# Patient Record
Sex: Male | Born: 1935 | Race: White | Hispanic: No | Smoking: Current every day smoker
Health system: Southern US, Community
[De-identification: ages and names within clinical notes are randomized; demographics above are authoritative.]

## PROBLEM LIST (undated history)

## (undated) DIAGNOSIS — I639 Cerebral infarction, unspecified: Secondary | ICD-10-CM

## (undated) DIAGNOSIS — H548 Legal blindness, as defined in USA: Secondary | ICD-10-CM

## (undated) DIAGNOSIS — F329 Major depressive disorder, single episode, unspecified: Secondary | ICD-10-CM

## (undated) DIAGNOSIS — I10 Essential (primary) hypertension: Secondary | ICD-10-CM

## (undated) DIAGNOSIS — M545 Low back pain, unspecified: Secondary | ICD-10-CM

## (undated) DIAGNOSIS — I739 Peripheral vascular disease, unspecified: Secondary | ICD-10-CM

## (undated) DIAGNOSIS — E7800 Pure hypercholesterolemia, unspecified: Secondary | ICD-10-CM

## (undated) DIAGNOSIS — F419 Anxiety disorder, unspecified: Secondary | ICD-10-CM

## (undated) DIAGNOSIS — F32A Depression, unspecified: Secondary | ICD-10-CM

## (undated) DIAGNOSIS — G8929 Other chronic pain: Secondary | ICD-10-CM

## (undated) DIAGNOSIS — Z8719 Personal history of other diseases of the digestive system: Secondary | ICD-10-CM

## (undated) DIAGNOSIS — I6529 Occlusion and stenosis of unspecified carotid artery: Secondary | ICD-10-CM

## (undated) HISTORY — PX: BACK SURGERY: SHX140

## (undated) HISTORY — DX: Legal blindness, as defined in USA: H54.8

## (undated) HISTORY — DX: Major depressive disorder, single episode, unspecified: F32.9

## (undated) HISTORY — DX: Depression, unspecified: F32.A

## (undated) HISTORY — DX: Peripheral vascular disease, unspecified: I73.9

## (undated) HISTORY — PX: PROSTATE SURGERY: SHX751

## (undated) HISTORY — PX: LUMBAR DISC SURGERY: SHX700

## (undated) HISTORY — PX: ANTERIOR CERVICAL DECOMP/DISCECTOMY FUSION: SHX1161

## (undated) HISTORY — PX: TONSILLECTOMY: SUR1361

## (undated) HISTORY — DX: Anxiety disorder, unspecified: F41.9

## (undated) HISTORY — PX: CATARACT EXTRACTION W/ INTRAOCULAR LENS  IMPLANT, BILATERAL: SHX1307

## (undated) HISTORY — DX: Occlusion and stenosis of unspecified carotid artery: I65.29

---

## 1998-10-09 ENCOUNTER — Encounter: Payer: Self-pay | Admitting: Emergency Medicine

## 1998-10-09 ENCOUNTER — Emergency Department (HOSPITAL_COMMUNITY): Admission: EM | Admit: 1998-10-09 | Discharge: 1998-10-09 | Payer: Self-pay | Admitting: Emergency Medicine

## 2003-07-24 HISTORY — PX: INGUINAL HERNIA REPAIR: SUR1180

## 2003-08-19 ENCOUNTER — Encounter (HOSPITAL_BASED_OUTPATIENT_CLINIC_OR_DEPARTMENT_OTHER): Payer: Self-pay | Admitting: General Surgery

## 2003-08-20 ENCOUNTER — Ambulatory Visit (HOSPITAL_COMMUNITY): Admission: RE | Admit: 2003-08-20 | Discharge: 2003-08-20 | Payer: Self-pay | Admitting: General Surgery

## 2007-10-10 ENCOUNTER — Ambulatory Visit: Payer: Self-pay | Admitting: Vascular Surgery

## 2009-04-22 HISTORY — PX: AORTOGRAM: SHX6300

## 2009-04-28 ENCOUNTER — Ambulatory Visit: Payer: Self-pay | Admitting: Vascular Surgery

## 2009-05-06 ENCOUNTER — Ambulatory Visit: Payer: Self-pay | Admitting: Vascular Surgery

## 2009-05-12 ENCOUNTER — Ambulatory Visit (HOSPITAL_COMMUNITY): Admission: RE | Admit: 2009-05-12 | Discharge: 2009-05-12 | Payer: Self-pay | Admitting: Vascular Surgery

## 2009-05-12 ENCOUNTER — Ambulatory Visit: Payer: Self-pay | Admitting: Vascular Surgery

## 2009-05-27 ENCOUNTER — Telehealth (INDEPENDENT_AMBULATORY_CARE_PROVIDER_SITE_OTHER): Payer: Self-pay | Admitting: *Deleted

## 2009-05-28 ENCOUNTER — Ambulatory Visit: Payer: Self-pay

## 2009-05-28 ENCOUNTER — Encounter: Payer: Self-pay | Admitting: Cardiovascular Disease

## 2009-06-03 ENCOUNTER — Encounter: Admission: RE | Admit: 2009-06-03 | Discharge: 2009-06-03 | Payer: Self-pay | Admitting: Vascular Surgery

## 2009-06-03 ENCOUNTER — Ambulatory Visit: Payer: Self-pay | Admitting: Vascular Surgery

## 2010-01-02 ENCOUNTER — Ambulatory Visit: Payer: Self-pay | Admitting: Vascular Surgery

## 2011-02-09 ENCOUNTER — Inpatient Hospital Stay (HOSPITAL_COMMUNITY)
Admission: EM | Admit: 2011-02-09 | Discharge: 2011-02-10 | DRG: 065 | Disposition: A | Discharge status: Home / Self Care | Payer: Medicare Other | Attending: Internal Medicine | Admitting: Internal Medicine

## 2011-02-09 ENCOUNTER — Emergency Department (HOSPITAL_COMMUNITY): Payer: Medicare Other

## 2011-02-09 DIAGNOSIS — I6350 Cerebral infarction due to unspecified occlusion or stenosis of unspecified cerebral artery: Principal | ICD-10-CM | POA: Diagnosis present

## 2011-02-09 DIAGNOSIS — H548 Legal blindness, as defined in USA: Secondary | ICD-10-CM | POA: Diagnosis present

## 2011-02-09 DIAGNOSIS — F17200 Nicotine dependence, unspecified, uncomplicated: Secondary | ICD-10-CM | POA: Diagnosis present

## 2011-02-09 DIAGNOSIS — I10 Essential (primary) hypertension: Secondary | ICD-10-CM | POA: Diagnosis present

## 2011-02-09 DIAGNOSIS — G8190 Hemiplegia, unspecified affecting unspecified side: Secondary | ICD-10-CM | POA: Diagnosis present

## 2011-02-09 DIAGNOSIS — I739 Peripheral vascular disease, unspecified: Secondary | ICD-10-CM | POA: Diagnosis present

## 2011-02-09 DIAGNOSIS — E785 Hyperlipidemia, unspecified: Secondary | ICD-10-CM | POA: Diagnosis present

## 2011-02-09 LAB — COMPREHENSIVE METABOLIC PANEL
Albumin: 3.9 g/dL (ref 3.5–5.2)
Alkaline Phosphatase: 86 U/L (ref 39–117)
BUN: 9 mg/dL (ref 6–23)
Chloride: 106 mEq/L (ref 96–112)
Creatinine, Ser: 1.02 mg/dL (ref 0.4–1.5)
Glucose, Bld: 96 mg/dL (ref 70–99)
Total Bilirubin: 0.4 mg/dL (ref 0.3–1.2)

## 2011-02-09 LAB — CBC
MCHC: 34 g/dL (ref 30.0–36.0)
MCV: 92.8 fL (ref 78.0–100.0)
Platelets: 284 10*3/uL (ref 150–400)
RDW: 14.3 % (ref 11.5–15.5)
WBC: 9.2 10*3/uL (ref 4.0–10.5)

## 2011-02-09 LAB — CARDIAC PANEL(CRET KIN+CKTOT+MB+TROPI)
CK, MB: 5.5 ng/mL — ABNORMAL HIGH (ref 0.3–4.0)
Total CK: 153 U/L (ref 7–232)
Troponin I: 0.01 ng/mL (ref 0.00–0.06)

## 2011-02-09 LAB — DIFFERENTIAL
Basophils Absolute: 0 10*3/uL (ref 0.0–0.1)
Eosinophils Absolute: 0.1 10*3/uL (ref 0.0–0.7)
Eosinophils Relative: 1 % (ref 0–5)
Monocytes Absolute: 1.1 10*3/uL — ABNORMAL HIGH (ref 0.1–1.0)

## 2011-02-09 LAB — URINALYSIS, ROUTINE W REFLEX MICROSCOPIC
Bilirubin Urine: NEGATIVE
Glucose, UA: NEGATIVE mg/dL
Hgb urine dipstick: NEGATIVE
Ketones, ur: 15 mg/dL — AB
Protein, ur: NEGATIVE mg/dL
Urobilinogen, UA: 1 mg/dL (ref 0.0–1.0)

## 2011-02-09 LAB — APTT: aPTT: 33 seconds (ref 24–37)

## 2011-02-09 LAB — PROTIME-INR: INR: 1.08 (ref 0.00–1.49)

## 2011-02-10 DIAGNOSIS — I6350 Cerebral infarction due to unspecified occlusion or stenosis of unspecified cerebral artery: Secondary | ICD-10-CM

## 2011-02-10 DIAGNOSIS — I059 Rheumatic mitral valve disease, unspecified: Secondary | ICD-10-CM

## 2011-02-10 LAB — HEMOGLOBIN A1C
Hgb A1c MFr Bld: 6.1 % — ABNORMAL HIGH (ref <5.7)
Mean Plasma Glucose: 128 mg/dL — ABNORMAL HIGH (ref <117)

## 2011-02-10 LAB — LIPID PANEL
Cholesterol: 148 mg/dL (ref 0–200)
HDL: 36 mg/dL — ABNORMAL LOW (ref >39)
Total CHOL/HDL Ratio: 4.1 RATIO
VLDL: 37 mg/dL (ref 0–40)

## 2011-02-10 NOTE — Discharge Summary (Addendum)
NAME:  Jerry [name], Jerry [name] NO.:  [id number]  MEDICAL RECORD NO.:  [id number]           PATIENT TYPE:  I  LOCATION:  1434                         FACILITY:  [facility]  PHYSICIAN:  Marcellus Scott, MD     DATE OF BIRTH:  Apr 10, 1933  DATE OF ADMISSION:  02/09/2011 DATE OF DISCHARGE:  02/10/2011                              DISCHARGE SUMMARY   PRIMARY CARE PHYSICIAN:  The patient does not have a primary care physician but wishes to see Dr. Pearson Grippe who is his wife's primary care physician.  DISCHARGE DIAGNOSES: 1. New onset right brain stroke (infarct) with left hemiparesis. 2. Tobacco abuse. 3. Hypertension.  DISCHARGE MEDICATIONS: 1. Enteric-coated aspirin 325 mg p.o. daily. 2. Lisinopril 10 mg p.o. daily. 3. Nicotine 21 mg per 24-hour patch transdermally daily. 4. Simvastatin 20 mg p.o. at bedtime.  IMAGING: 1. MRI of the head without contrast, impression is small to slightly     moderate sized acute nonhemorrhagic infarct, extends from the right     temporal lobe along the posterior right periopercular region and     into the junction of the posterior right frontal and parietal lobe.  2. MRA of the head.  Impression:   Mild to moderate narrowing involving portions of the cavernous and supraclinoid segment of the internal carotid artery bilaterally.   A 2.1 mm bulge anterior aspect of the left internal carotid artery cavernous segment.  This may represent result of atherosclerosis rather than a small aneurysm.  Prominent left infundibulum measuring up to 2.9 mm.  Stability of these findings can be confirmed on followup.   Mild-to-moderate middle cerebral artery branch vessel irregularity bilaterally.  Mild irregularity A2 segment anterior cerebral artery branches bilaterally.   Nonvisualization of the PICAs.  Moderate focal stenosis distal left vertebral artery.  Mild irregularity of the basal artery without high- grade stenosis.  Irregularity of the distal  posterior cerebral artery branches bilaterally.  3. CT of the head without contrast February 09, 2011, impression:   No evidence of cortical infarction or other acute intracranial process.   Small lacunar stroke in the right thalamus and right caudate head, likely longstanding.   Left mastoid opacification without coalescence.  4. A 2-D echocardiogram shows mild hypokinesis at the base of the     inferior wall.  The cavity size was normal.  Wall thickness was     increased in the pattern of mild left ventricular hypertrophy.     Estimated ejection fraction was 60%.  Doppler parameters are     consistent with abnormal left ventricular relaxation (grade 1     diastolic dysfunction).  Mild dilatation of the aortic root.  Mild     mitral valve regurgitation.  Pulmonary artery peak pressure is 33     mmHg.  Impression is there is no obvious cardiac source of embolus.  5. Carotid Dopplers; right side is 40-59% midrange ICA stenosis.  Left     is greater than 80% proximal ICA stenosis.  Bilateral vertebral     artery flow is antegrade. LABORATORY DATA: 1. Lipid panel significant for triglycerides 183 and HDL  36, otherwise     within normal limits. 2. Urinalysis did not show features of urinary tract infection. 3. Cardiac enzymes x1 not indicative of acute coronary syndrome. 4. Comprehensive metabolic panel within normal limits with BUN 9 and     creatinine 1.02. 5. Coagulation indices were within normal limits. 6. CBC within normal limits.  Hemoglobin 14.4 and hematocrit 42.4.  CONSULTATIONS:  Neurology: Marlan Palau, M.D.  DIET:  Heart healthy mechanical soft diet with thin liquids.  ACTIVITIES:  Increase activity slowly.  COMPLAINTS TODAY:  None.  The patient indicates that his left limb strength has almost returned to normal.  He also indicates that his facial asymmetry and speech is also improving.  He has ambulated in the halls with Physical Therapy.  PHYSICAL EXAMINATION:   GENERAL:  Jerry [name] is in no obvious distress. VITAL SIGNS:  Telemetry shows sinus rhythm in the 60s-70s per minute. Temperature 98 degrees Fahrenheit, pulse 66 per minute, respiration 20 per minute, blood pressure 156/97 mmHg, and saturating at 97% on room air. RESPIRATORY SYSTEM:  Clear.  No increased work of breathing. CARDIOVASCULAR SYSTEM:  First and second heart sounds heard.  Regular rate and rhythm.  No murmurs or JVD. ABDOMEN:  Nondistended, nontender, and soft and bowel sounds present. CENTRAL NERVOUS SYSTEM:  The patient is awake, alert, and oriented x3. The patient has left facial asymmetry.  He has dysarthria. EXTREMITIES:  With grade 4/5 in the left lower extremity but other limbs are grade 5/5.  HOSPITAL COURSE:  Jerry [name] is a 77 year old right-handed Caucasian male patient with history of tobacco abuse and no significant past medical history and on no medications prior to admission who woke up on the morning of February 09, 2011, with slurred speech and left-sided weakness and he apparently fell out of bed.  He presented to the emergency room where MRI of the head confirmed right temporoparietal stroke.  Thepatient was not a TPA candidate secondary to unknown duration of symptoms.  He also drinks alcohol on occasions. 1. Right temporal parietal stroke with associated dysarthria, facial     asymmetry, and left hemiparesis.  The patient was admitted to the     hospital.  He was started on aspirin.  Extensive evaluation     including imaging and blood tests were done as above.  He has     improved in the sense that his left limb strengths have improved.     He does continue to have some facial asymmetry and dysarthria.     Neurology has seen him, and I have discussed with Dr. Hoy Morn who     has cleared him for discharge on a statin and aspirin.  He will be     followed up by the neurologist in their offices in 3 weeks from     discharge.  Physical Therapy and Occupational  Therapy have     evaluated him and do not see any home needs.  The patient has been     repeatedly counseled regarding abstinence from smoking, compliance     with medications, and MD visits.  He verbalizes understanding. 2. Tobacco abuse.  Cessation counseling done and a nicotine patch will     be provided. 3. Hypertension.  We will allow some permissive hypertension secondary     to recent acute stroke but will go home on lisinopril. 4. A 2-D echocardiogram showed mild hypokinesis of the inferior wall,     although the left ventricle ejection fraction was normal.  Consider     outpatient Cardiology consultation and evaluation as deemed     necessary. 5. Dyslipidemia.  The patient was started on a low-dose statin.  DISPOSITION:  The patient is discharged home in stable condition.  FOLLOWUP RECOMMENDATIONS: 1. With Dr. Pearson Grippe.  The patient is to call for an appointment to     be seen in 1-2 weeks. 2. With Dr. Delia Heady.  The patient is to call for an appointment     to be seen in 3 weeks from discharge.  TIME TAKEN:  Coordinating this discharge is 35 minutes.     Marcellus Scott, MD     AH/MEDQ  D:  02/10/2011  T:  02/10/2011  Job:  [id number]  cc:   Massie Maroon, MD Fax: [phone]  Pramod P. Pearlean Brownie, MDFax: [phone]  Electronically Signed by Marcellus Scott MD on 02/10/2011 10:24:12 PM

## 2011-02-16 NOTE — H&P (Signed)
NAME:  Jerry [name], BURESH NO.:  [id number]  MEDICAL RECORD NO.:  [id number]           PATIENT TYPE:  E  LOCATION:  [facility]LED                         FACILITY:  [facility]  PHYSICIAN:  Erick Blinks, MD     DATE OF BIRTH:  1933/02/17  DATE OF ADMISSION:  02/09/2011 DATE OF DISCHARGE:                             HISTORY & PHYSICAL   PRIMARY CARE PHYSICIAN:  The patient does not have a primary care physician.  CHIEF COMPLAINT:  Left-sided weakness.  HISTORY OF PRESENT ILLNESS:  This is a 77 year old gentleman who has not been seen by a primary care physician and does not report any significant past medical history.  He is not on any medications at home. He reports to the emergency room this morning with complaints of left- sided weakness.  He reports that he was feeling fine when he went to sleep last night.  When he woke up this morning, he had left-sided weakness and numbness in his arm as well as his legs.  He noticed that he was having difficulty with his speech as well.  He came to the emergency room for evaluation where CT head was unremarkable, but MRI of brain shows an acute stroke.  The patient will be admitted for further workup of his stroke.  He denies any other complaints including fever, shortness of breath, chest pain, cough, abdominal pain, nausea, vomiting and diarrhea.  He says he currently has some urinary hesitancy, but otherwise denies any other complaints.  He has not had any changes in his vision or headache.  PAST MEDICAL HISTORY:  Per records, 1. Peripheral vascular disease.  He has seen Dr. Edilia Bo in the past,     but has not undergone any intervention. 2. Legally blind. 3. Tobacco abuse.  MEDICATIONS:  Prior to admission none.  ALLERGIES:  No known drug allergies.  SOCIAL HISTORY:  The patient smokes a pack of cigarettes per day.  He says he drinks alcohol only on occasion.  Denies any illicit drug use. Lives at home with his  wife.  FAMILY HISTORY:  The patient's mother passed away from lung cancer in her 57s.  His father passed away from possibly an MI or possibly complications of alcohol in his 63s as well.  REVIEW OF SYSTEMS:  Aspiration.  PHYSICAL EXAMINATION:  VITAL SIGNS:  Blood pressure 150/88, heart rate of 84, respiratory rate of 18, temperature 98.7, pulse oximetry 95% on room air.  HEENT:  Normocephalic, atraumatic.  Pupils are equal, round, reactive to light. NECK:  Supple. CHEST:  Clear to auscultation bilaterally. CARDIAC:  S1 and S2 with a regular rate and rhythm. ABDOMEN:  Soft, nontender.  Bowel sounds are active. EXTREMITIES:  No cyanosis, clubbing, or edema. NEUROLOGIC:  The patient has 5/5 strength bilaterally in his upper and lower extremities.  He has significant left-sided facial droop and he has some significant dysphagia.  LABORATORY DATA:  Hemoglobin 14.4, WBC 9.4, platelet count of 284.  INR of 1.08.  Sodium 136, potassium 4.4, chloride 106, bicarbonate 23, BUN 9, creatinine 1.02, calcium 8.7, glucose of 96.  Liver function tests within normal  limits.  CT head shows no acute disease.  MRI shows small- to-moderate-sized acute infarction which extends in the right temporal lobe along the posterior right periauricular region and into the junction of the posterior right frontal and parietal lobe.  MRA of the head shows mild-to-moderate narrowing, involving portion with cavernous and supraclinoid segment of internal carotid artery bilaterally; 2.1 mm bulge anterior aspect of the left internal carotid artery cavernous segment, this may represent results of atherosclerosis rather than small aneurysm; prominent left infundibulum measuring up to 2.9 mm; stability of these findings can be confirmed on followup; mild-to-moderate middle cerebral artery branches with irregularity bilaterally; mild irregularity A2 segment anterior cerebral artery branches bilaterally; nonvisualization of  the PICA; moderate focal stenosis distal left vertebral artery; mild irregularity of the basilar artery without high- grade stenosis; irregularity of the distal posterior cerebral artery branches bilaterally.  ASSESSMENT AND PLAN: 1. Acute cerebrovascular accident. 2. Peripheral vascular disease. 3. Legally blind. 4. High blood pressure. 5. Tobacco abuse.  PLAN:  We will admit the patient to Telemetry and complete stroke workup, including carotid Dopplers and 2-D echocardiogram.  We will check a hemoglobin A1c and a fasting lipid panel.  We will provide him with a nicotine patch for his smoking and he has been counseled extensively on the harmful effects of tobacco, main intervention for this patient will be risk factor modification.  We will start him on lisinopril daily for his blood pressure and start him on a daily aspirin.  We will ask Speech Therapy to see him for his dysphagia as well as Physical Therapy and Occupational Therapy.  We will also ask for tobacco cessation consult.  Further orders per the clinical course.     Erick Blinks, MD     JM/MEDQ  D:  02/09/2011  T:  02/09/2011  Job:  [id number]  Electronically Signed by Durward Mallard MEMON  on 02/16/2011 07:21:53 PM

## 2011-02-21 DIAGNOSIS — I639 Cerebral infarction, unspecified: Secondary | ICD-10-CM

## 2011-02-21 HISTORY — DX: Cerebral infarction, unspecified: I63.9

## 2011-02-23 NOTE — Consult Note (Signed)
NAME:  Jerry [name] NO.:  [id number]  MEDICAL RECORD NO.:  [id number]           PATIENT TYPE:  I  LOCATION:  1434                         FACILITY:  [facility]  PHYSICIAN:  Marlan Palau, M.D.  DATE OF BIRTH:  02-11-1933  DATE OF CONSULTATION: DATE OF DISCHARGE:                                CONSULTATION   HISTORY OF PRESENT ILLNESS:  Jerry [name] is a 77 year old right-handed white male born on 05-12-1933 with a history of tobacco abuse. The patient was on no medications prior to coming in the hospital.  He is not followed by primary care physician.  The patient was in good health on the evening of February 08, 2011.  The patient awakened today with some slurred speech and left-sided weakness.  The patient apparently fell out of bed.  The patient had a pint of vodka last evening.  MRI of the brain confirmed the presence of a right temporoparietal stroke.  Atherosclerotic changes were noted on MRA of the head.  The patient was brought in for further management.  The patient has been placed on aspirin.  NIH stroke scale score is 3.  The patient is not t-PA candidate secondary to duration of symptoms. Neurology is consulted for further evaluation.  PAST MEDICAL HISTORY:  Significant for: 1. New right temporoparietal stroke. 2. Hypertension. 3. Tobacco abuse. 4. History of cervical spine surgery on 4 occasions. 5. History of peripheral vascular disease. 6. Blindness. 7. History of "dyslexia," the patient is illiterate. 8. History of bilateral cataract surgery and laser surgery on the     eyes.  MEDICATIONS:  At this time include: 1. Aspirin 300 mg suppository daily. 2. Lisinopril 10 mg daily. 3. Ativan if needed.  The patient was on no medications prior to coming in.  SOCIAL HISTORY:  The patient smoked a pack of cigarettes daily.  Drinks alcohol on occasions.  SOCIAL HISTORY:  This patient is married, lives in [place] area.   The patient is retired.  The patient has 4 children, 1 daughter committed suicide, 1 son died with meningitis.  FAMILY HISTORY:  Mother died with cancer.  Father died with myocardial infarction.  The patient has 4 brothers and no sisters.  One brother died with myocardial infarction.  REVIEW OF SYSTEMS:  Notable for no recent fevers or chills.  The patient does note frequent headache.  Does have some low back pain, it is persistent.  The patient denies shortness of breath, chest pain, abdominal pain, nausea, vomiting, troubles controlling bowels or bladder, dizziness or blackout episodes.  PHYSICAL EXAMINATION:  VITALS:  Blood pressure is 150/81, heart rate 84, respiratory rate 22, temperature afebrile. GENERAL:  This patient is a fairly well-developed white male who is alert, cooperative on the time of examination. HEENT EXAMINATION:  Head is atraumatic.  EYES:  Pupils are equal, round and reactive to light.  Extraocular movements are full.  Visual fields are full.  Double simultaneous stimulation on motor testing. NECK:  Supple.  No carotid bruits noted. RESPIRATORY EXAMINATION:  Clear. CARDIOVASCULAR EXAMINATION:  Reveals regular rate and rhythm.  No obvious  murmurs or rubs noted.  EXTREMITIES:  Without significant edema. NEUROLOGIC EXAMINATION:  Cranial nerves as above.  Facial symmetry is not present.  Has depression of the left nasolabial fold.  Extraocular movements again are full.  Speech is dysarthric, not aphasic.  Motor testing reveals fairly good strength in all 4s.  To direct testing, the patient has no significant drift with either upper or lower extremity, very mild weakness noted distally in left arm.  The patient notes decreased pinprick sensation in the left hand as compared to the right, symmetric in the legs.  Vibratory sensation is symmetric throughout. The patient has fair finger-nose-finger and heel-to-shin bilaterally. Gait was not tested.  The patient has no  evidence of extinction to double simultaneous stimulation.  Deep tendon reflexes depressed but symmetric.  Toes neutral bilaterally.  LABORATORY DATA:  Laboratory values notable for white count 9.2, hemoglobin of 14.4, hematocrit 42.4, MCV of 92.8, platelets of 284.  INR of 1.08, sodium 136, potassium 4.4, chloride of 106, CO2 of 23, glucose of 96, BUN of 9, creatinine of 1.02, total bili of 0.4, alk phosphatase of 86, SGOT of 25, SGPT 22, total protein 7.6, albumin 3.9, calcium 8.7, CK of 153, MB fraction 5.5, troponin-I 0.01.  Urinalysis reveals specific gravity of 0.018, pH of 6.5, ketones 15 mg/dL.  MRI of the head is as above.  IMPRESSION: 1. New onset of right brain stroke. 2. Tobacco abuse.  This patient will be admitted for further management of the stroke event.  The patient currently is on aspirin.  The patient will be set up for 2-D echocardiogram and carotid Doppler study.  Physical and occupational therapy will see this patient.  We will follow the patient's clinical course while in-house.     Marlan Palau, M.D.     CKW/MEDQ  D:  02/09/2011  T:  02/10/2011  Job:  [id number]  Electronically Signed by Thana Farr M.D. on 02/23/2011 08:30:43 AM

## 2011-03-01 LAB — POCT I-STAT, CHEM 8
BUN: 13 mg/dL (ref 6–23)
Calcium, Ion: 1.1 mmol/L — ABNORMAL LOW (ref 1.12–1.32)
Hemoglobin: 15.6 g/dL (ref 13.0–17.0)
Sodium: 139 mEq/L (ref 135–145)
TCO2: 24 mmol/L (ref 0–100)

## 2011-03-22 ENCOUNTER — Emergency Department (HOSPITAL_COMMUNITY): Payer: Medicare Other

## 2011-03-22 ENCOUNTER — Encounter (HOSPITAL_COMMUNITY): Payer: Self-pay | Admitting: Radiology

## 2011-03-22 ENCOUNTER — Emergency Department (HOSPITAL_COMMUNITY)
Admission: EM | Admit: 2011-03-22 | Discharge: 2011-03-22 | Disposition: A | Discharge status: Home / Self Care | Payer: Medicare Other | Attending: Emergency Medicine | Admitting: Emergency Medicine

## 2011-03-22 DIAGNOSIS — R61 Generalized hyperhidrosis: Secondary | ICD-10-CM | POA: Insufficient documentation

## 2011-03-22 DIAGNOSIS — R5381 Other malaise: Secondary | ICD-10-CM | POA: Insufficient documentation

## 2011-03-22 DIAGNOSIS — R0682 Tachypnea, not elsewhere classified: Secondary | ICD-10-CM | POA: Insufficient documentation

## 2011-03-22 DIAGNOSIS — E785 Hyperlipidemia, unspecified: Secondary | ICD-10-CM | POA: Insufficient documentation

## 2011-03-22 DIAGNOSIS — R51 Headache: Secondary | ICD-10-CM | POA: Insufficient documentation

## 2011-03-22 DIAGNOSIS — I6350 Cerebral infarction due to unspecified occlusion or stenosis of unspecified cerebral artery: Secondary | ICD-10-CM | POA: Insufficient documentation

## 2011-03-22 DIAGNOSIS — R29898 Other symptoms and signs involving the musculoskeletal system: Secondary | ICD-10-CM | POA: Insufficient documentation

## 2011-03-22 DIAGNOSIS — I10 Essential (primary) hypertension: Secondary | ICD-10-CM | POA: Insufficient documentation

## 2011-03-22 DIAGNOSIS — R209 Unspecified disturbances of skin sensation: Secondary | ICD-10-CM | POA: Insufficient documentation

## 2011-03-22 DIAGNOSIS — Z8673 Personal history of transient ischemic attack (TIA), and cerebral infarction without residual deficits: Secondary | ICD-10-CM | POA: Insufficient documentation

## 2011-03-22 HISTORY — DX: Essential (primary) hypertension: I10

## 2011-03-22 LAB — BASIC METABOLIC PANEL WITH GFR
BUN: 13 mg/dL (ref 6–23)
Chloride: 104 meq/L (ref 96–112)
Potassium: 4.1 meq/L (ref 3.5–5.1)
Sodium: 135 meq/L (ref 135–145)

## 2011-03-22 LAB — CBC
HCT: 40.8 % (ref 39.0–52.0)
Hemoglobin: 14.2 g/dL (ref 13.0–17.0)
MCH: 32.4 pg (ref 26.0–34.0)
MCHC: 34.8 g/dL (ref 30.0–36.0)
MCV: 93.2 fL (ref 78.0–100.0)
Platelets: 279 10*3/uL (ref 150–400)
RBC: 4.38 MIL/uL (ref 4.22–5.81)
RDW: 14.7 % (ref 11.5–15.5)
WBC: 9.7 K/uL (ref 4.0–10.5)

## 2011-03-22 LAB — BASIC METABOLIC PANEL
CO2: 24 mEq/L (ref 19–32)
Calcium: 9 mg/dL (ref 8.4–10.5)
Creatinine, Ser: 1.13 mg/dL (ref 0.4–1.5)
GFR calc Af Amer: >60 mL/min (ref >60)
GFR calc non Af Amer: >60 mL/min (ref >60)
Glucose, Bld: 97 mg/dL (ref 70–99)

## 2011-03-22 LAB — PROTIME-INR
INR: 0.92 (ref 0.00–1.49)
Prothrombin Time: 12.6 seconds (ref 11.6–15.2)

## 2011-03-22 LAB — DIFFERENTIAL
Basophils Absolute: 0.1 10*3/uL (ref 0.0–0.1)
Basophils Relative: 1 % (ref 0–1)
Eosinophils Absolute: 0.1 10*3/uL (ref 0.0–0.7)
Eosinophils Relative: 1 % (ref 0–5)
Lymphocytes Relative: 26 % (ref 12–46)
Lymphs Abs: 2.6 K/uL (ref 0.7–4.0)
Monocytes Absolute: 1.2 K/uL — ABNORMAL HIGH (ref 0.1–1.0)
Monocytes Relative: 12 % (ref 3–12)
Neutro Abs: 5.7 10*3/uL (ref 1.7–7.7)
Neutrophils Relative %: 59 % (ref 43–77)

## 2011-03-22 LAB — APTT: aPTT: 29 s (ref 24–37)

## 2011-03-30 ENCOUNTER — Other Ambulatory Visit: Payer: Self-pay | Admitting: Neurology

## 2011-03-30 DIAGNOSIS — R269 Unspecified abnormalities of gait and mobility: Secondary | ICD-10-CM

## 2011-03-30 DIAGNOSIS — I6350 Cerebral infarction due to unspecified occlusion or stenosis of unspecified cerebral artery: Secondary | ICD-10-CM

## 2011-03-31 NOTE — Consult Note (Signed)
NAME:  Jerry, [name] NO.:  [id number]  MEDICAL RECORD NO.:  [id number]           PATIENT TYPE:  E  LOCATION:  [facility]                         FACILITY:  [facility]  PHYSICIAN:  Thana Farr, MD    DATE OF BIRTH:  1933-04-16  DATE OF CONSULTATION:  03/22/2011 DATE OF DISCHARGE:  03/22/2011                                CONSULTATION   HISTORY:  Jerry [name] is a 77 year old male that was admitted in March 2012 with a right temporoparietal stroke.  The patient was given recommendations to start a cholesterol medications, some aspirin, and quit smoking.  Although, the patient does report taking 650 mg of aspirin at night.  He did not follow any of the other recommendations. Today while cooking, had acute onset of numbness and weakness, this start on the left side of his face and travel down to the left side of his body.  It lasted approximately 30 minutes and resolved on its own. The patient was encouraged to seek medical attention from his family. The patient reports that he is now back to baseline.  PAST MEDICAL HISTORY:  Stroke in March 2012, hypertension, cervical spine surgery, peripheral vascular disease, dyslexia, legally blind.  MEDICATIONS:  Aspirin 650 mg nightly.  ALLERGIES:  No known drug allergies.  SOCIAL HISTORY:  The patient smokes a pack a day of tobacco.  There is no history of alcohol or illicit drug abuse.  PHYSICAL EXAMINATION:  Blood pressure 151/83, heart rate 82, respiratory rate 16, temperature 98.6, O2 sat 99% on room air.  Mental status testing:  The patient is alert and oriented.  The patient follow commands without difficulty.  Speech is fluent.  On cranial nerve testing II, visual fields are grossly intact.  III, IV, VI, extraocular movements intact.  V and VII, left facial droop.  VIII, grossly intact. IX and X, positive gag.  XI, bilateral shoulder shrug.  XII, midline tongue extension.  On motor exam, the patient gives 5-/5 left  hand grip. Otherwise, there is a left upper extremity pronator drift.  The patient is 5/5 on the right.  He has 5/5 in both lower extremities.  On sensory test, the patient reports intact pinprick and light touch bilaterally. Deep tendon reflexes are 1+ throughout with absent ankle jerks. Plantars are upgoing on the left and downgoing on the right.  On cerebellar testing, finger-to-nose and heel-to-shin intact.  LABORATORY DATA:  Sodium of 135, potassium 4.1, chloride 104, bicarb 24, BUN and creatinine 13 and 1.13 respectively, glucose 97.  Hemoglobin and hematocrit 14.4 and 40.8 respectively, platelet count 279, white blood cell count 9.7.  PT, INR, and PTT 12.6, 0.92, and 29 respectively. Calcium 9.0.  CT shows a subacute right frontal temporal infarct. Echocardiogram from February 10, 2011 shows no embolus.  Dopplers showed 80% mid left ICA stenosis.  This is from February 10, 2011.  ASSESSMENT:  Jerry [name] is a 77 year old male with transient ischemic attack now approximately 1 month since his acute stroke in March.  He has been noncompliant with the recommendations.  He reports that he will likely remain noncompliant.  Cannot rule  out the possibility that this may have been a possible seizure.  His symptoms have completely cleared per the patient.  PLAN: 1. Would change aspirin to Plavix 75 mg p.o. daily. 2. Discontinue tobacco use. 3. MRI of the brain without contrast. 4. EEG as an outpatient to rule out seizure. 5. The patient is noncompliant.  Recent workup has been performed.  The     patient may be discharged from ED after MRI and follow up with Dr.     Pearlean Brownie as an outpatient.          ______________________________ Thana Farr, MD     LR/MEDQ  D:  03/22/2011  T:  03/23/2011  Job:  [id number]  Electronically Signed by Thana Farr MD on 03/31/2011 06:17:19 PM

## 2011-04-06 NOTE — Assessment & Plan Note (Signed)
OFFICE VISIT   Jerry [name], Jerry [name] A  DOB:  1933/05/24                                       06/03/2009  CHART#:[id number]   I saw the patient in the office today to discuss his results of his  aortogram.  This is a 77 year old gentleman who I have been following  with peripheral vascular disease.  He had presented with progressive  symptoms in the left leg which had been gradually increasing over the  last 6 months.  Given the progression of his symptoms we elected to  proceed with arteriography.  His arteriogram was performed on 05/12/2009  and this showed a small abdominal aortic aneurysm.  He then had diffuse  iliac disease on the left with probably a 60-70% stenosis on the  external iliac artery on the right and on the left side he had more  diffuse disease with an occluded hypogastric artery and an 80% diffuse  external iliac stenosis on the left.  He had patent femoral and  popliteal system bilaterally with tibial occlusive disease bilaterally.  He did undergo a Cardiolite as part of his workup and this showed a  small apical lateral infarct with no ischemia.  In addition, given the  small aneurysm that was seen on angiogram he did have a CT scan that  shows the aneurysm is approximately 3.5 cm in maximum diameter.   Since I saw him last he actually states the symptoms in his left leg  have improved.  He does have some claudication on the left but no  significant rest pain.  He has had no nonhealing ulcers.   On examination his blood pressure is 148/91, heart rate is 78.  Lungs  are clear bilaterally to auscultation.  On cardiac exam he has a regular  rate and rhythm.  He has palpable femoral pulses although slightly  diminished.  He has palpable posterior tibial pulses bilaterally.   As his symptoms have improved currently he wishes to not pursue surgery.  I think if his symptoms progress and he wishes to pursue surgery given  his small aneurysm and all  things considered I think his best option  would be an aortofemoral bypass graft.  I think his main risk is his  pulmonary status and he smokes 1-1/2 packs per day of cigarettes.  We  had a long discussion in the office today about the importance of  tobacco cessation in terms of progression of his disease and the risk of  limb loss and also the risk of having postoperative pulmonary  complications and difficulty getting off the ventilator if he ultimately  did have to proceed with aortofemoral bypass grafting.  I plan on seeing  him back in 6 months.  He knows to call sooner if he has problems.  In  the meantime I have encouraged him again to quit smoking and to do as  much walking as possible.   Di Kindle. Edilia Bo, M.D.  Electronically Signed   CSD/MEDQ  D:  06/03/2009  T:  06/04/2009  Job:  [id number]

## 2011-04-06 NOTE — Op Note (Signed)
NAME:  Jerry [name], Jerry [name]                ACCOUNT NO.:  [id number]   MEDICAL RECORD NO.:  [id number]          PATIENT TYPE:  AMB   LOCATION:  SDS                          FACILITY:  [facility]   PHYSICIAN:  Di Kindle. Edilia Bo, M.D.DATE OF BIRTH:  [birth year]   DATE OF PROCEDURE:  05/12/2009  DATE OF DISCHARGE:                               OPERATIVE REPORT   PREOPERATIVE DIAGNOSIS:  Rest pain in the left foot with aortoiliac  occlusive disease.   POSTOPERATIVE DIAGNOSIS:  Rest pain in the left foot with aortoiliac  occlusive disease.   PROCEDURE:  1. Ultrasound-guided access to the left common femoral artery.  2. Aortogram with bilateral iliac arteriogram and bilateral lower      extremity runoff.   SURGEON:  Di Kindle. Edilia Bo, M.D.   ANESTHESIA:  Local with sedation.   HISTORY:  This is a pleasant 77 year old gentleman with a long history  of peripheral vascular disease and a long history of tobacco abuse.  He  had rest pain in the left foot for 2 years but over the last 6 months,  his symptoms have progressed significantly.  It was felt that he also  had developed some discoloration of the toes of the left foot involving  the great toe and now the adjacent second and third and fourth toes.  On  exam, he had diminished femoral pulse and was brought in for  arteriography and possible iliac angioplasty.   TECHNIQUE:  The patient was taken to the PV Lab and sedated with 0.5 mg  of Versed and 25 mcg of fentanyl.  Both groins were prepped and draped  in the usual sterile fashion.  After the skin was infiltrated with 1%  lidocaine and under ultrasound guidance, the left common femoral artery  was cannulated and a guidewire was introduced into the infrarenal aorta  under fluoroscopic control.  A 5-French sheath was introduced over the  wire.  Next, a pigtail catheter was positioned at the L1 vertebral body  and flush aortogram obtained.  The catheter was then repositioned above  the  aortic bifurcation and oblique iliac projections were obtained.  Of  note, I had also obtained a lateral projection of the aorta.  The  pigtail catheter was then removed after bilateral lower extremity runoff  films were obtained.  Additional spot films were obtained of the left  leg.  Also a lateral projection was obtained of the foot.   FINDINGS:  There are single renal arteries bilaterally with no  significant renal artery stenosis identified.  The celiac and SMA are  patent on lateral projection.  There is some slight ectasia of the  distal infrarenal aorta.  It is not possible to determine if the patient  has an aneurysm based on this study.  The right common iliac artery is  patent and has mild diffuse disease.  The external iliac artery on the  right has diffuse moderate disease with approximately 60% stenosis.  The  hypogastric artery on the right is patent but has diffuse disease.  On  the left side, there is some slight ectasia of the  distal common iliac  artery.  There is an 80% long-segment stenosis of the external iliac  artery on the left and the internal iliac artery on the left is  occluded.   On the right side, the common femoral, superficial femoral, deep  femoral, and popliteal arteries are patent.  There is severe diffuse  disease of the anterior tibial artery on the right.  The peroneal and  posterior tibial arteries on the right are patent.   On the left side, the common femoral, deep femoral, superficial femoral,  and popliteal arteries are patent.  The below-knee popliteal  tibioperoneal trunk and peroneal are patent.  The anterior tibial artery  is patent but then is diseased distally and the dorsalis pedis is  occluded on the left.  The posterior tibial artery has moderate diffuse  disease and is occluded in the distal segment with some reconstitution  distally; however, the plantar vessels are diffusely diseased.   1. Diffuse iliac disease, greater on the  left than the right.      Possible infrarenal aorta CT scan has been ordered to rule out an      abdominal aortic aneurysm and iliac artery aneurysms.  2. No significant disease of the femoropopliteal system.  3. Tibial occlusive disease as described above.      Di Kindle. Edilia Bo, M.D.  Electronically Signed     CSD/MEDQ  D:  05/12/2009  T:  05/12/2009  Job:  [id number]   cc:   Tinnie Gens A. Petrinitz, D.P.M.

## 2011-04-06 NOTE — Assessment & Plan Note (Signed)
OFFICE VISIT   AMIRR, ACHORD A  DOB:  04-16-1933                                       05/06/2009  CHART#:[id number]   I saw the patient in the office today for continued followup of his  peripheral vascular disease.  I had originally seen him back in November  of 2008.  At that time he had a small wound on the plantar aspect of his  left great toe.  He was a two pack per day smoker and had a toe pressure  of 52 mmHg on the left.  I thought this was adequate for healing.  We  encouraged him to quit smoking.  He was lost to followup.  Ultimately  his wound on the toe did heal.   He presents today with increasing pain in the left leg.  He states that  he has had pain in the left foot for 2 years which has become  significantly worse over the last 6 months.  He has rest pain in the  left foot with no rest pain in the right.  He has bilateral lower  extremity claudication which involves his hips, thighs and calves  bilaterally.  His symptoms are more significant on the left.  His  symptoms occur at half a block and are relieved with rest.  He does not  experience these symptoms with standing.  He does have chronic low back  pain.  He has had no history of nonhealing ulcers recently.  He has  noted discoloration of the toes of his left foot starting with the left  great toe and now progressing to the adjacent second, third and fourth  toes.   PAST MEDICAL HISTORY:  Is fairly unremarkable.  He denies any history of  diabetes, hypertension, hypercholesterolemia, history of previous  myocardial infarction, history of congestive heart failure or history of  COPD.  He is legally blind.   FAMILY HISTORY:  There is no history of premature cardiovascular  disease.   SOCIAL HISTORY:  He is married and he has four children.  He has cut  back to one pack per day of cigarettes.  He has been smoking for 65  years.   REVIEW OF SYSTEMS AND MEDICATIONS:  Are documented  on the medical  history form in his chart.   PHYSICAL EXAMINATION:  General:  This is a pleasant 77 year old  gentleman who appears his stated age.  Vital signs:  His blood pressure  is 151/93, heart rate is 73.  HEENT:  Unremarkable.  Neck:  Is supple.  There is no cervical lymphadenopathy.  I do not detect any carotid  bruits.  Lungs:  Are clear bilaterally to auscultation.  Cardiac:  He  has a regular rate and rhythm.  Abdomen:  Soft and nontender.  I cannot  palpate an aneurysm.  He has normal pitched bowel sounds.   He has palpable femoral pulses bilaterally although the left femoral  pulse is diminished.  He has bilateral femoral bruits.  He has a  palpable right popliteal and right posterior tibial pulse.  I cannot  palpate a dorsalis pedis pulse on the right.  On the left side I cannot  palpate a biphasic posterior tibial and dorsalis pedis signal on the  right with an ABI of 100%.  On the left side he had monophasic signals  in the posterior tibial and dorsalis pedis position with an ABI of 80%.  However, I feel this is likely falsely elevated.  Toe pressure on the  left was 30 which is diminished from 52 back in 2008.  Toe pressure on  the right is 108 mmHg.   I think he has had significant progression of his peripheral vascular  disease on the left side since I saw him back in 2008.  He now has  significant rest pain with cyanosis of the toes and I think he has limb  threatening ischemia.  Based on his exam he likely has some iliac  occlusive disease and femoral disease in addition to infrainguinal  arterial occlusive disease.  I have recommended we proceed with  arteriography in order to determine if he is a candidate for  revascularization.  If he does have an iliac lesion amenable to  angioplasty this could potentially be addressed at the same time.  I  also had a long discussion with him about the importance of tobacco  cessation and have given him the number for [facility]  tobacco cessation  program.  We discussed the indications for arteriography and possible  iliac angioplasty.  We have also discussed the potential complications  including but not limited to bleeding, arterial injury, arterial  thrombosis, dissection and renal insufficiency.  All of his questions  were answered and he is agreeable to proceed.  His arteriogram and  possible angioplasty has been scheduled for 05/12/2009.  I have given  him a prescription for 40 hydrocodone for pain as he is having  significant rest pain.   Di Kindle. Edilia Bo, M.D.  Electronically Signed   CSD/MEDQ  D:  05/06/2009  T:  05/07/2009  Job:  [id number]   cc:   Tinnie Gens A. Petrinitz, D.P.M.

## 2011-04-06 NOTE — Assessment & Plan Note (Signed)
OFFICE VISIT   Jerry [name], Jerry [name]  DOB:  1933-02-23                                       01/02/2010  CHART#:[id number]   HISTORY OF PRESENT ILLNESS:  The patient is [name] who  we have been following for peripheral vascular disease.  He had  presented with progressive symptoms in the left leg including the great  through the third toes of the left foot.  He states that he has had,  they have been bluish in color, however, this has been better over the  last several months.  They are cool to the touch.  He has constant  aching pain in the toes which are not improved with the foot in the  dependent position.  He states his symptoms are worse at night but he  has pretty much continuous aching in the toes.  This has not progressed  to getting any worse.  He states that he had been taking one to two  Vicodin at night for the pain but otherwise takes no pain medication  during the day.  He has developed no new ulcers or drainage from the  foot.  He has no similar symptoms on the right side.  We have been  following his ABIs every 6 months.  Past angiogram performed in June of  2010 showed small abdominal aortic aneurysm, diffuse iliac disease on  the left with 60%-70% stenosis of the external iliac artery on the right  and on the left he has more diffuse disease with occluded hypogastric  artery and 80% diffuse external iliac stenosis.  He has [name] patent femoral  and popliteal system bilaterally with tibial occlusive disease.  Of  note, the patient states that he has no primary care physician and does  not see [name] doctor regularly.  However, he states that when he has been in  the hospital all his lab work and blood pressure have been stable.   PAST MEDICAL HISTORY:  Is significant for peripheral vascular disease.   MEDICATIONS:  He denies taking any medication except for [name] few aspirin  when he needs them.   ALLERGIES:  He has no known drug  allergies.   REVIEW OF SYSTEMS:  He denies hypertension, diabetes, COPD.  He does  have some coronary artery disease and [name] small aneurysm at 3.5 cm.   SOCIAL HISTORY:  He smokes one to one and [name] half packs of cigarettes per  day for the last 65 years.   REVIEW OF SYSTEMS:  The patient states he is legally blind.  He denies  any shortness of breath, chest pains, nausea, vomiting, diarrhea,  numbness or tingling in upper or lower extremities.   PHYSICAL EXAM:  Vital signs:  His blood pressure was systolic 150.  His  sats were 98% on room air and his heart rate was 82 and regular.  Bilateral lower extremities are warm to touch.  He is exquisitely tender  of the left great toe and second toe of the left foot.  All the toes of  the left foot are slightly more ruborous than on the right.  On the  right the toes appear normally pink.  Doppler signal on the right is at  the peroneal and PT and on the left peroneal, AT and PT per Doppler.  He  has no DP signals  on either foot.   VASCULAR LAB:  ABIs showed on the right the PT is 0.92 and on the left  is 0.87.  The DP is 0.79 on the right and 0.83 on the left.  The digit  on the right is 78 or 0.48 which is decreased from 6 months ago; 6  months ago it was 108.78.  On the left the digit index has improved to  0.24; it was 0.22 6 months ago and the Doppler signal was dampened in  the digit on the left.   IMPRESSION:  1. Aortoiliac occlusive disease with peripheral vascular disease left      and symptoms in the great and second toes of the left lower      extremity which are basically unchanged and slightly improved in      color.  However, the patient states he continues to have aching      pain in those digits which has not worsened.  2. Vascular lab slightly decreased on the right at the digit and      stable on the left.   PLAN:  We will see the patient back in 3 months for repeat ABIs.  He was  also given another prescription for  Vicodin 5/500 mg one to two tablets  at bedtime p.r.n. pain.  He was also given [name] prescription for Neurontin  300 mg p.o. q.h.s. #30.  The Vicodin is also 30 tablets.  He will be  followed by Dr. Edilia Bo in 3 months.  The patient was advised to see [name]  primary care physician for general care.   Della Goo, PA-C   [place]. Edilia Bo, M.D.  Electronically Signed   RR/MEDQ  D:  01/02/2010  T:  01/03/2010  Job:  [id number]

## 2011-04-06 NOTE — Consult Note (Signed)
VASCULAR SURGERY CONSULTATION   DORSEY, AUTHEMENT A  DOB:  [birth year]                                       10/10/2007  CHART#:[id number]   REFERRING PHYSICIAN:  Tinnie Gens A. Petrinitz, D.P.M.   I saw Mr. Jerry [name] in the office today in consultation concerning a wound  on the plantar aspect of his left great toe.  He was referred by Dr.  Wynelle Cleveland.   This is a pleasant 77 year old gentleman who approximately 3 weeks ago  went to a podiatrist with some complaints of some shooting pain in his  left great toe.  Apparently there was a small lesion on the toe which  was excised and he later developed a nonhealing wound in this area.  He  was evaluated by Dr. Wynelle Cleveland and noted to have absent pedal pulses on  the left.  He was sent for vascular consultation.  He has continued to  have some pain in the left great toe which he describes as shooting  pains.  The pain has been relatively stable over the last 2 weeks.  There are really no aggravating or alleviating symptoms.  He denies any  history of claudication, history of rest pain or history of previous  nonhealing ulcers.  He feels that the wound has remained relatively  stable over the last 3 weeks.   PAST MEDICAL HISTORY:  Unremarkable.  He denies any history of diabetes,  hypertension, hypercholesterolemia, history of previous myocardial  infarction, history of congestive heart failure or history of COPD.   FAMILY HISTORY:  There is no history of premature cardiovascular  disease.   SOCIAL HISTORY:  He is married.  He is retired.  He smokes 2 packs per  day of cigarettes but states that he quit today.  He does not currently  use alcohol, though he has used it in the past.   REVIEW OF SYSTEMS:  Documented on the medical history form in his chart.  Of note, he is currently on no medications.   PHYSICAL EXAMINATION:  This is a pleasant 77 year old gentleman who  appears his stated age.  He is in no acute distress.   Blood pressure is  137/79, heart rate is 86.  Saturation is 98%.  There is no cervical  lymphadenopathy.  I cannot detect any carotid bruits.  LUNGS:  Clear bilaterally to auscultation.  CARDIAC EXAM:  He has a regular rate and rhythm.  ABDOMEN:  Soft and nontender.  I could not palpate an aneurysm.  He has  normal pitch bowel sounds.  He has palpable femoral and popliteal pulses  bilaterally.  I cannot palpate pedal pulses on the left.  He does have a  palpable dorsalis pedis on the right.  He has a small black eschar on  the plantar aspect of his left great toe.  There is no surrounding  erythema or drainage currently.  He has no significant lower extremity  swelling.  There are no rashes present.   Doppler study in our office today shows triphasic Doppler signals in the  right foot with biphasic Doppler signals in the left foot.  Ankle-  brachial index on the right is normal at 100%.  Ankle-brachial index on  the left is 83%.  Of note, the toe pressure on the right is 96 mmHg, and  toe pressure on the left is 52 mmHg.  Mr. Bluett does have evidence of infrainguinal arterial occlusive  disease on the left which appears to be mostly tibial occlusive disease.  However, he has fairly brisk Doppler signals and the toe pressure of 52  mmHg suggests adequate circulation for healing, although somewhat  borderline.  Typically a pressure greater than 40 in nondiabetics is  adequate for healing.  We have had a long discussion about the  importance of staying off tobacco and how this will significantly  improve his chances of healing the toe wound.  He seems committed to  staying off tobacco. For these reasons, at this point I would not  proceed with arteriography and consider revascularization unless the toe  wound fails to heal.  We will return him to Dr. Wynelle Cleveland to follow  this, and if there is any change, we would be happy to see him again to  arrange for an arteriogram in consideration  for bypass.   Di Kindle. Edilia Bo, M.D.  Electronically Signed  CSD/MEDQ  D:  10/10/2007  T:  10/11/2007  Job:  [id number]   cc:   Tinnie Gens A. Petrinitz, D.P.M.

## 2011-04-07 ENCOUNTER — Ambulatory Visit
Admission: RE | Admit: 2011-04-07 | Discharge: 2011-04-07 | Disposition: A | Discharge status: Home / Self Care | Payer: Medicare Other | Source: Ambulatory Visit | Attending: Neurology | Admitting: Neurology

## 2011-04-07 DIAGNOSIS — I6350 Cerebral infarction due to unspecified occlusion or stenosis of unspecified cerebral artery: Secondary | ICD-10-CM

## 2011-04-07 DIAGNOSIS — R269 Unspecified abnormalities of gait and mobility: Secondary | ICD-10-CM

## 2011-04-07 MED ORDER — IOHEXOL 350 MG/ML SOLN: 100.0000 mL | Freq: Once | INTRAVENOUS | Status: AC | PRN

## 2011-04-09 ENCOUNTER — Ambulatory Visit: Payer: Medicare Other | Admitting: *Deleted

## 2011-04-09 NOTE — Op Note (Signed)
NAME:  Jerry [name], Jerry [name] NO.:  [id number]   MEDICAL RECORD NO.:  [id number]                   PATIENT TYPE:  OIB   LOCATION:  2899                                 FACILITY:  [facility]   PHYSICIAN:  Leonie Man, M.D.                DATE OF BIRTH:  1933/05/07   DATE OF PROCEDURE:  08/20/2003  DATE OF DISCHARGE:                                 OPERATIVE REPORT   PREOPERATIVE DIAGNOSIS:  Right inguinal hernia and left inguinal hernia.   POSTOPERATIVE DIAGNOSIS:  Right inguinal hernia and left inguinal hernia.   PROCEDURE:  Repair of right inguinal hernia and repair of left inguinal  hernia with mesh.   SURGEON:  Leonie Man, M.D.   ASSISTANT:  Nurse.   ANESTHESIA:  General.   The patient is a 77 year old man who presented with bilateral inguinal  hernias with the right side being somewhat larger than the left.  He has  been having some primarily right-sided symptoms.  He comes to the operating  room now for bilateral inguinal hernia repairs after the risks and potential  benefits of surgery have been fully discussed, all questions answered, and  consent obtained.   DESCRIPTION OF PROCEDURE:  Following the induction of satisfactory general  anesthesia with the patient positioned supinely, the lower abdomen and  groins were prepped and draped to be included in the sterile operative  field.  I marked out symmetrical incisions in the lower abdominal crease on  both the right and left side.  The left side was first infiltrated with 0.5%  Marcaine with epinephrine and a transverse incision made in the lower  abdominal crease, deepened through the skin and subcutaneous tissue,  dissecting down through the external oblique aponeurosis.  Additional  injections of Marcaine were used throughout the course of the operation.  The external oblique aponeurosis was opened up through the external inguinal  ring with protection of the ilioinguinal nerve, which was  retracted.  The  spermatic cord was elevated, held with a Penrose drain, and dissected free  from the large direct hernial sac.  On dissecting on within the spermatic  cord there was no evidence of an indirect sac noted.  The direct space was  then cleared and repaired with an onlay patch of polypropylene mesh, sewn in  at the pubic tubercle, carried up along the conjoined tendon to the internal  ring, and again from the pubic tubercle, along the shelving edge of  Poupart's ligament, to the internal ring.  The tails of the mesh were then  trimmed and sutured down into the internal oblique muscles behind the cord,  thus closing off the opening to the internal ring.  Sponge, instrument, and  sharp counts were then verified.  The spermatic cord returned to its normal  anatomic position and the external oblique aponeurosis closed over the cord  with a running 2-0 Vicryl suture.  Subcutaneous tissues were then closed  with 3-0 Vicryl suture.  Attention was then turned to the right side, where  a symmetrically placed right-sided lower groin crease incision was made with  dissection carried down to the external oblique aponeurosis.  The external  oblique aponeurosis was opened up through the external inguinal ring with  protection of the ilioinguinal nerve.  Spermatic cord elevated and held with  a Penrose drain.  A somewhat larger direct sac was dissected free from the  spermatic cord, reduced into the retroperitoneum.  There was no indirect sac  noted on dissection along the spermatic cord.  The right-sided direct  inguinal hernia was then repaired with a polypropylene patch sewn in at the  pubic tubercle and carried up along the conjoined tendon to the internal  ring and again from the pubic tubercle up along the shelving edge of  Poupart's ligament to the internal ring.  Both of these suture lines were  done with 2-0 Novofil.  The tails of the mesh were then trimmed and sutured  down behind  the spermatic cord with 2-0 Novofil.  Sponge, instrument, and  sharp counts were then verified.  All the areas of the dissection were  checked for hemostasis and noted to be dry.  The external oblique  aponeurosis closed over the cord with a running suture of 2-0 Vicryl,  subcutaneous tissues in both wounds closed with a running 3-0 Vicryl suture,  and the skin was closed in both the right and left wounds with a running 4-0  Monocryl suture.  Wounds were both reinforced with Steri-Strips.  Sterile  dressings applied, anesthetic reversed, and the patient removed from the  operating room to the recovery room in stable condition.  He tolerated the  procedure well.                                               Leonie Man, M.D.    PB/MEDQ  D:  08/20/2003  T:  08/20/2003  Job:  [id number]

## 2011-04-13 ENCOUNTER — Other Ambulatory Visit: Payer: Self-pay | Admitting: Internal Medicine

## 2011-04-13 DIAGNOSIS — R9389 Abnormal findings on diagnostic imaging of other specified body structures: Secondary | ICD-10-CM

## 2011-04-14 ENCOUNTER — Encounter: Payer: Medicare Other | Admitting: Vascular Surgery

## 2011-04-20 ENCOUNTER — Inpatient Hospital Stay
Admission: RE | Admit: 2011-04-20 | Discharge: 2011-04-20 | Payer: Medicare Other | Source: Ambulatory Visit | Attending: Internal Medicine | Admitting: Internal Medicine

## 2011-04-21 ENCOUNTER — Encounter (INDEPENDENT_AMBULATORY_CARE_PROVIDER_SITE_OTHER): Payer: Medicare Other | Admitting: Vascular Surgery

## 2011-04-21 DIAGNOSIS — I6529 Occlusion and stenosis of unspecified carotid artery: Secondary | ICD-10-CM

## 2011-04-22 NOTE — H&P (Signed)
HISTORY AND PHYSICAL EXAMINATION  Apr 21, 2011  Re:  Jerry [name], Jerry [name]                DOB:  [birth year]  REASON FOR ADMISSION:  Greater than 80% left carotid stenosis.  HISTORY:  This is [name] pleasant 77 year old gentleman whom I have been following in the past with peripheral vascular disease.  He has had 2 recent episodes at the end of February and then again in March where he fell.  He had developed sudden onset of slurred speech and had been admitted at the end of March of this year, at which time an MRI showed evidence of [name] right temporoparietal stroke.  At that time, his carotid duplex scan showed [name] 40% to 59% right carotid stenosis in the lower end of that range and [name] greater than 80% left carotid stenosis.  He has subsequently had [name] CT angio, which was performed on 04/07/2011, which shows 84% stenosis of the left internal carotid artery with 30% stenosis of the proximal right internal carotid artery.  He was sent for evaluation for possible consideration for carotid endarterectomy.  Of note, he denies having any focal weakness or paresthesias at the time of his event in March, although it is documented in his records that he had some left-sided weakness.  He is unaware of any previous history of TIAs or amaurosis fugax.  He has had some problems with blurred vision and double vision.  Of note, he is [name] poor historian, and it is somewhat difficult to pin down the history.  PAST MEDICAL HISTORY:  Significant for hypertension.  He denies any history of diabetes, hypercholesterolemia, history of previous myocardial infarction, history of congestive heart failure or history of COPD.  SOCIAL HISTORY:  He is married.  He lives with his wife.  He has 4 children.  He smokes [name] pack per day of cigarettes and has been smoking for 60 years.  FAMILY HISTORY:  He is unaware of any history of premature cardiovascular disease.  REVIEW OF SYSTEMS:  GENERAL:  He has had no  weight loss, weight gain or problem with his appetite. CARDIOVASCULAR:  He has had no chest pain, chest pressure, palpitations or arrhythmias.  He does have bilateral lower extremity calf claudication.  He has had no rest pain nor nonhealing ulcers.  He has had no history of DVT or phlebitis. NEUROLOGIC:  He has occasional headaches. GI:  Unremarkable. PULMONARY:  Unremarkable. HEMATOLOGIC:  Unremarkable. GU:  Unremarkable. MUSCULOSKELETAL:  Unremarkable. PSYCHIATRIC:  Unremarkable. INTEGUMENTARY:  Unremarkable.  ALLERGIES:  No known drug allergies.  MEDICATIONS:  Aspirin 325 mg p.o. daily.  PHYSICAL EXAMINATION:  General:  This is [name] pleasant 77 year old gentleman who appears his stated age.  Vital Signs:  Blood pressure 154/88, heart rate is 80, saturation 97%.  HEENT: Unremarkable.  Lungs: Clear bilaterally to auscultation.  Cardiovascular Exam:  I do not detect any carotid bruits.  He has [name] regular rate and rhythm.  I cannot palpate pedal pulses; however, both feet are adequately perfused. Abdomen:  Soft and nontender with normal pitched bowel sounds. Musculoskeletal Exam:  There are no major deformities or cyanosis. Neurologic Exam:  I do not detect any focal weakness or paresthesias. Skin:  There are no ulcers or rashes.  We have discussed the importance of tobacco cessation, and he does understand that this is clearly [name] significant part of his problem with respect to his diffuse vascular disease.  We discussed the importance of tobacco cessation.  I have recommended left carotid endarterectomy.  I have reviewed his duplex scan which shows evidence of [name] greater than 80% left carotid stenosis.  He has [name] 40% to 59% right carotid stenosis in the lower end of that range.  I have also reviewed his CT angiogram which confirms [name] tight left carotid stenosis with only [name] mild right carotid stenosis.  Of note, CT angio also shows some mild to moderate narrowing of the cavernous  segment of the internal carotid arteries bilaterally.  I have also reviewed his MRI which shows [name] very small acute focal infarct in the anterior medial right temporal lobe and an acute infarct in the medial right frontal lobe.  He has remote tiny thalamic infarcts.  This MRI was on 03/22/11.  We have discussed the indications for left carotid endarterectomy and potential complications including but not limited to bleeding, stroke (periprocedural risk 1% to 2%), nerve injury, MI, or other unpredictable medical problems.  All of his questions were answered, and he is agreeable to proceed.  His surgery has been scheduled for 04/30/11.    Di Kindle. Edilia Bo, M.D. Electronically Signed  CSD/MEDQ  D:  04/21/2011  T:  04/22/2011  Job:  [id number]  cc:   Marlan Palau, M.D.

## 2011-04-29 ENCOUNTER — Encounter (HOSPITAL_COMMUNITY)
Admission: RE | Admit: 2011-04-29 | Discharge: 2011-04-29 | Disposition: A | Discharge status: Home / Self Care | Payer: Medicare Other | Source: Ambulatory Visit | Attending: Vascular Surgery | Admitting: Vascular Surgery

## 2011-04-29 ENCOUNTER — Other Ambulatory Visit: Payer: Self-pay | Admitting: Vascular Surgery

## 2011-04-29 ENCOUNTER — Ambulatory Visit (HOSPITAL_COMMUNITY)
Admission: RE | Admit: 2011-04-29 | Discharge: 2011-04-29 | Disposition: A | Discharge status: Home / Self Care | Payer: Medicare Other | Source: Ambulatory Visit | Attending: Vascular Surgery | Admitting: Vascular Surgery

## 2011-04-29 DIAGNOSIS — I6522 Occlusion and stenosis of left carotid artery: Secondary | ICD-10-CM

## 2011-04-29 DIAGNOSIS — Z01810 Encounter for preprocedural cardiovascular examination: Secondary | ICD-10-CM | POA: Insufficient documentation

## 2011-04-29 DIAGNOSIS — I6529 Occlusion and stenosis of unspecified carotid artery: Secondary | ICD-10-CM | POA: Insufficient documentation

## 2011-04-29 DIAGNOSIS — Z01818 Encounter for other preprocedural examination: Secondary | ICD-10-CM | POA: Insufficient documentation

## 2011-04-29 DIAGNOSIS — Z01812 Encounter for preprocedural laboratory examination: Secondary | ICD-10-CM | POA: Insufficient documentation

## 2011-04-29 LAB — PROTIME-INR
INR: 1.08 (ref 0.00–1.49)
Prothrombin Time: 14.2 seconds (ref 11.6–15.2)

## 2011-04-29 LAB — CBC
Hemoglobin: 15.2 g/dL (ref 13.0–17.0)
MCH: 32.1 pg (ref 26.0–34.0)
MCHC: 34.4 g/dL (ref 30.0–36.0)
Platelets: 289 10*3/uL (ref 150–400)
RDW: 14 % (ref 11.5–15.5)

## 2011-04-29 LAB — COMPREHENSIVE METABOLIC PANEL
BUN: 14 mg/dL (ref 6–23)
CO2: 27 mEq/L (ref 19–32)
Calcium: 9.2 mg/dL (ref 8.4–10.5)
Creatinine, Ser: 0.94 mg/dL (ref 0.4–1.5)
GFR calc non Af Amer: >60 mL/min (ref >60)
Glucose, Bld: 71 mg/dL (ref 70–99)
Total Protein: 7.6 g/dL (ref 6.0–8.3)

## 2011-04-29 LAB — URINALYSIS, ROUTINE W REFLEX MICROSCOPIC
Glucose, UA: NEGATIVE mg/dL
Ketones, ur: NEGATIVE mg/dL
Nitrite: NEGATIVE
Protein, ur: NEGATIVE mg/dL
pH: 6 (ref 5.0–8.0)

## 2011-04-29 LAB — ABO/RH: ABO/RH(D): O POS

## 2011-04-30 ENCOUNTER — Other Ambulatory Visit: Payer: Self-pay | Admitting: Vascular Surgery

## 2011-04-30 ENCOUNTER — Inpatient Hospital Stay (HOSPITAL_COMMUNITY)
Admission: RE | Admit: 2011-04-30 | Discharge: 2011-05-01 | DRG: 039 | Disposition: A | Discharge status: Home / Self Care | Payer: Medicare Other | Source: Ambulatory Visit | Attending: Vascular Surgery | Admitting: Vascular Surgery

## 2011-04-30 DIAGNOSIS — I10 Essential (primary) hypertension: Secondary | ICD-10-CM | POA: Diagnosis present

## 2011-04-30 DIAGNOSIS — Z01812 Encounter for preprocedural laboratory examination: Secondary | ICD-10-CM

## 2011-04-30 DIAGNOSIS — Z8673 Personal history of transient ischemic attack (TIA), and cerebral infarction without residual deficits: Secondary | ICD-10-CM

## 2011-04-30 DIAGNOSIS — F17200 Nicotine dependence, unspecified, uncomplicated: Secondary | ICD-10-CM | POA: Diagnosis present

## 2011-04-30 DIAGNOSIS — I658 Occlusion and stenosis of other precerebral arteries: Secondary | ICD-10-CM | POA: Diagnosis present

## 2011-04-30 DIAGNOSIS — I9589 Other hypotension: Secondary | ICD-10-CM | POA: Diagnosis not present

## 2011-04-30 DIAGNOSIS — I6529 Occlusion and stenosis of unspecified carotid artery: Secondary | ICD-10-CM

## 2011-04-30 HISTORY — PX: CAROTID ENDARTERECTOMY: SUR193

## 2011-04-30 LAB — TYPE AND SCREEN
ABO/RH(D): O POS
ABO/RH(D): O POS
Antibody Screen: NEGATIVE

## 2011-05-01 LAB — BASIC METABOLIC PANEL
CO2: 23 mEq/L (ref 19–32)
Chloride: 102 mEq/L (ref 96–112)
Glucose, Bld: 105 mg/dL — ABNORMAL HIGH (ref 70–99)
Sodium: 134 mEq/L — ABNORMAL LOW (ref 135–145)

## 2011-05-01 LAB — CBC
Hemoglobin: 11.6 g/dL — ABNORMAL LOW (ref 13.0–17.0)
MCH: 32.1 pg (ref 26.0–34.0)
Platelets: 223 10*3/uL (ref 150–400)
RBC: 3.65 MIL/uL — ABNORMAL LOW (ref 4.22–5.81)
WBC: 12.7 10*3/uL — ABNORMAL HIGH (ref 4.0–10.5)

## 2011-05-03 NOTE — Discharge Summary (Addendum)
  NAMEMarland [name]  READ, BONELLI NO.:  [id number]  MEDICAL RECORD NO.:  [id number]  LOCATION:  3311                         FACILITY:  [facility]  PHYSICIAN:  Di Kindle. Edilia Bo, M.D.DATE OF BIRTH:  1933-09-05  DATE OF ADMISSION:  04/30/2011 DATE OF DISCHARGE:  05/01/2011                              DISCHARGE SUMMARY   CHIEF COMPLAINT:  Symptomatic greater than 80% left carotid stenosis.  HISTORY OF PRESENT ILLNESS:  Mr. Jerry [name] is a 77 year old gentleman who we have been following for peripheral vascular disease.  He had two recent episodes at the end of February of this year and March where he fell, developed sudden onset of slurred speech and was admitted the end of March.  An MRI showed evidence of right temporoparietal stroke.  His carotid duplex scan at that time showed 40-59% right carotid stenosis and a greater than 80% left carotid stenosis.  CT angio confirmed greater than 80% stenosis of the left carotid and he was admitted for an elective left carotid endarterectomy.  The patient denied any focal weakness or paresthesias at the time of the event in March, but it was noted that he had some problems with blurred vision and double vision. He is unaware of any previous history of TIA or amaurosis.  PAST MEDICAL HISTORY.: 1. Hypertension. 2. He denies any history of diabetes, hypercholesterolemia, myocardial     infarction, congestive heart failure, or COPD.  HOSPITAL COURSE:  The patient was taken to the operating room on April 30, 2011, for a left carotid endarterectomy with Dacron patch angioplasty. Postoperatively, the patient did well.  He required some dopamine drip for hypotension, but this was successfully weaned by the first postoperative day.  Neurologically, he remained intact.  He had no tongue deviation.  No facial droop.  No difficulty swallowing.  He had a slight headache the night before, but this has also resolved.  He had no focal deficits.  He  had good and equal strength bilateral upper and lower extremities.  His left neck wound was healing well without hematoma.  FINAL DIAGNOSES: 1. Symptomatic critical left carotid stenosis status post left carotid     endarterectomy. 2. Postoperative hypotension, which resolved spontaneously but     required overnight dopamine drip, and he was ambulating, voiding,     and taking p.o. and he was discharged to home.  DISPOSITION:  He will follow up with Dr. Edilia Bo in 2 weeks.  DISCHARGE MEDICATIONS:  Oxycodone 5/325 mg 1-2 tablets every 6 hours as needed for pain and aspirin 325 mg daily.     Della Goo, PA-C   ______________________________ Di Kindle. Edilia Bo, M.D.    RR/MEDQ  D:  05/01/2011  T:  05/01/2011  Job:  [id number]  Electronically Signed by Della Goo PA on 05/03/2011 03:45:16 PM Electronically Signed by Waverly Ferrari M.D. on 05/04/2011 07:06:38 PM

## 2011-05-04 NOTE — Op Note (Signed)
NAME:  Jerry [name], Jerry [name] NO.:  [id number]  MEDICAL RECORD NO.:  [id number]  LOCATION:                                 FACILITY:  PHYSICIAN:  Di Kindle. Edilia Bo, M.D.DATE OF BIRTH:  1933/02/02  DATE OF PROCEDURE: DATE OF DISCHARGE:                              OPERATIVE REPORT   PREOPERATIVE DIAGNOSIS:  Greater than 80% left carotid stenosis.  POSTOPERATIVE DIAGNOSIS:  Greater than 80% left carotid stenosis.  PROCEDURE:  Left carotid endarterectomy with Dacron patch angioplasty.  SURGEON:  Di Kindle. Edilia Bo, MD.  ASSISTANT:  Pecola Leisure, PA.  ANESTHESIA:  General.  INDICATIONS:  This is a 77 year old gentleman who had some problems with slurred speech and was admitted in March of this year, at which time, her MRI showed right brain stroke.  He had only a moderate right carotid stenosis, but greater than 80% left carotid stenosis.  I saw in my consultation on Apr 21, 2011.  He had a CT angiogram, which showed only a mild right carotid stenosis, but he had a greater than 80% left carotid stenosis.  Left carotid endarterectomy was recommended in order to lower his risk of future stroke.  TECHNIQUE:  The patient was taken to the operating room and received general anesthetic.  Arterial line had been placed by anesthesia.  The left neck was prepped and draped in usual sterile fashion.  An incision was made along the anterior border of the sternocleidomastoid and dissection carried down to the common carotid artery, which was dissected free, controlled with a Rumel tourniquet.  The plaque extended very low and I had to extend the incision towards the manubrium and dissect very low on the common carotid artery in order to get below the plaque.  Next, the external carotid artery and superior thyroid arteries were controlled.  The internal carotid artery was controlled.  The plaque extended fairly high in this direction also and had to get  fairly high on the internal carotid artery in order to get above the plaque. The patient was then heparinized.  Clamps were then placed on the internal, then the external, then the common carotid artery.  A longitudinal arteriotomy was made in the common carotid artery and this was extended to the plaque into the internal carotid artery.  A 10 shunt was placed into the internal carotid artery back bled and then placed in the common carotid artery and secured with Rumel tourniquet.  Flow was reestablished to the shunt.  An endarterectomy plane was established proximally and the plaque was sharply divided.  Eversion endarterectomy was performed of the external carotid artery.  Distally, there was a nice taper in the plaque and no tacking sutures were required.  The artery was irrigated with copious amounts of saline and dextran, and all loose debris were removed.  A long Dacron patch was then sewn using two continuous 6-0 Prolene sutures.  Prior to completing the patch closure, the shunt was removed.  The arteries were back bled and flushed appropriately and the anastomosis completed.  Flow was reestablished first to the external carotid artery and into the internal carotid artery.  Completion was a good signal distal to the  patch.  There was a good pulse.  Hemostasis was obtained in the wound.  Heparin was partially reversed with protamine.  The wound was closed with a deep layer of 3-0 Vicryl. The platysma was closed with running 3-0 Vicryl and the skin closed with a 4-0 subcuticular stitch.  Sterile dressing was applied.  The patient tolerated the procedure well, was transferred to recovery room in stable condition.  All needle and sponge counts were correct.     Di Kindle. Edilia Bo, M.D.     CSD/MEDQ  D:  04/30/2011  T:  05/01/2011  Job:  [id number]  cc:   Marlan Palau, M.D.  Electronically Signed by Waverly Ferrari M.D. on 05/04/2011 07:06:35 PM

## 2011-05-07 ENCOUNTER — Ambulatory Visit (INDEPENDENT_AMBULATORY_CARE_PROVIDER_SITE_OTHER): Payer: Medicare Other

## 2011-05-07 ENCOUNTER — Ambulatory Visit: Payer: Medicare Other | Admitting: *Deleted

## 2011-05-07 DIAGNOSIS — I6529 Occlusion and stenosis of unspecified carotid artery: Secondary | ICD-10-CM

## 2011-05-10 NOTE — Assessment & Plan Note (Signed)
OFFICE VISIT  Jerry [name], Jerry [name] A DOB:  08/14/1933                                       05/07/2011 CHART#:[id number]  His surgery date was 04/30/2011.  This is a 77 year old gentleman who had a left carotid endarterectomy with Dacron patch angioplasty by Dr. Edilia Bo on 04/30/2011.  He comes in today with complaints of hallucinations, being nauseated and vomiting. Upon talking with the patient he states that he has had no hallucinations since being home.  He states that they did give him morphine in the hospital and he had hallucinations at that time.  He states that over the past few days he has not had a bowel movement and has been nauseated and has had some vomiting.  He did take stool softeners and laxatives this morning and he has had a bowel movement and his abdomen feels better now.  He also states that he has eaten today and he is feeling much better.  He states that he has had some chills. However, he did not take his temperature but it sounds like could have had a fever.  I have discussed with the patient that this was probably a GI virus and that he is getting better now.  He has a followup appointment with Dr. Edilia Bo next week and we will keep that appointment.  PHYSICAL EXAMINATION:  His vital signs today, blood pressure in the right arm is 158/95 with a mean of 110, heart rate is 78.  He is 98% on room air.  His incision is healing nicely.  His lungs were clear to auscultation bilaterally.  His abdomen is soft, nontender, nondistended with positive bowel sounds.  The patient notes that he has a followup appointment and he is to keep this.  He also has been informed that he needs to follow up with his primary care physician regarding his blood pressure.  He also knows that if he has any problems that he can feel free to call us and he will be seen.  Newton Pigg, PA  Di Kindle. Edilia Bo, M.D. Electronically Signed  SE/MEDQ  D:   05/07/2011  T:  05/07/2011  Job:  [id number]

## 2011-05-13 ENCOUNTER — Ambulatory Visit (INDEPENDENT_AMBULATORY_CARE_PROVIDER_SITE_OTHER): Payer: Medicare Other

## 2011-05-13 DIAGNOSIS — I6529 Occlusion and stenosis of unspecified carotid artery: Secondary | ICD-10-CM

## 2011-05-13 NOTE — Assessment & Plan Note (Signed)
OFFICE VISIT  Jerry [name], Jerry [name] DOB:  Sep 21, 1933                                       05/13/2011 CHART#:[id number]  This is a 77 year old male who presents for a routine follow-up status post left carotid endarterectomy performed by Dr. Edilia Bo on April 30, 2011.  He was seen last week in the physician's assistant clinic with complaints of hallucinations, nausea and vomiting.  Upon evaluation in the office, the patient stated that he only had hallucinations with morphine while in the hospital and had no further issues since that time.  He stated that he was having some nausea postoperatively, but once he took some stool softeners and had a bowel movement this also subsequently resolved.  The patient was able to restart his diet without any difficulty and has felt well since that time.  Today he is without complaint.  He states he feels well and denies all signs and symptoms of TIA and CVA.  He is having regular bowel movements.  The patient is still smoking and states that he will "never quit."  PHYSICAL EXAMINATION:  Blood pressure 166/94 left arm sitting, O2 saturation 99% on room air.  Heart rate 79%.  In general, he is well- nourished in no acute distress.  HEENT:  PERLA.  EOMI.  The neck incision is healing well.  There is no evidence of erythema or skin breakdown nor hematoma.  He is neurologically intact.  The patient is doing well status post left carotid endarterectomy.  He is instructed to establish with primary care physician regarding his blood pressure today as well as an elevated pressure of 158/95 when he was in the office last week.  The patient states he gets nervous when he comes into the office, but he states that he will establish a primary care physician regarding this.  Smoking cessation is again discussed with the patient, but he refuses to quit smoking.  He will follow up in 6 months with a carotid duplex and follow-up evaluation by  Dr. Edilia Bo. The patient is instructed to call if any questions, issues, or problems during this time.  Pecola Leisure, PA  Charles E. Fields, MD Electronically Signed  AY/MEDQ  D:  05/13/2011  T:  05/13/2011  Job:  [id number]

## 2011-10-25 ENCOUNTER — Encounter: Payer: Self-pay | Admitting: Vascular Surgery

## 2011-11-04 ENCOUNTER — Emergency Department (HOSPITAL_COMMUNITY)
Admission: EM | Admit: 2011-11-04 | Discharge: 2011-11-04 | Disposition: A | Discharge status: Home / Self Care | Payer: Medicare Other | Attending: Emergency Medicine | Admitting: Emergency Medicine

## 2011-11-04 ENCOUNTER — Emergency Department (HOSPITAL_COMMUNITY): Payer: Medicare Other

## 2011-11-04 ENCOUNTER — Encounter (HOSPITAL_COMMUNITY): Payer: Self-pay | Admitting: Emergency Medicine

## 2011-11-04 ENCOUNTER — Other Ambulatory Visit: Payer: Self-pay

## 2011-11-04 DIAGNOSIS — H548 Legal blindness, as defined in USA: Secondary | ICD-10-CM | POA: Insufficient documentation

## 2011-11-04 DIAGNOSIS — Z8673 Personal history of transient ischemic attack (TIA), and cerebral infarction without residual deficits: Secondary | ICD-10-CM | POA: Insufficient documentation

## 2011-11-04 DIAGNOSIS — I10 Essential (primary) hypertension: Secondary | ICD-10-CM | POA: Insufficient documentation

## 2011-11-04 DIAGNOSIS — R5381 Other malaise: Secondary | ICD-10-CM | POA: Insufficient documentation

## 2011-11-04 DIAGNOSIS — R51 Headache: Secondary | ICD-10-CM | POA: Insufficient documentation

## 2011-11-04 DIAGNOSIS — R42 Dizziness and giddiness: Secondary | ICD-10-CM | POA: Insufficient documentation

## 2011-11-04 DIAGNOSIS — I739 Peripheral vascular disease, unspecified: Secondary | ICD-10-CM | POA: Insufficient documentation

## 2011-11-04 LAB — APTT: aPTT: 38 seconds — ABNORMAL HIGH (ref 24–37)

## 2011-11-04 LAB — COMPREHENSIVE METABOLIC PANEL
ALT: 17 U/L (ref 0–53)
Alkaline Phosphatase: 110 U/L (ref 39–117)
BUN: 11 mg/dL (ref 6–23)
CO2: 25 mEq/L (ref 19–32)
Chloride: 99 mEq/L (ref 96–112)
GFR calc Af Amer: >90 mL/min (ref >90)
Glucose, Bld: 105 mg/dL — ABNORMAL HIGH (ref 70–99)
Potassium: 3.1 mEq/L — ABNORMAL LOW (ref 3.5–5.1)
Sodium: 135 mEq/L (ref 135–145)
Total Bilirubin: 0.2 mg/dL — ABNORMAL LOW (ref 0.3–1.2)
Total Protein: 7.2 g/dL (ref 6.0–8.3)

## 2011-11-04 LAB — GLUCOSE, CAPILLARY: Glucose-Capillary: 115 mg/dL — ABNORMAL HIGH (ref 70–99)

## 2011-11-04 LAB — URINALYSIS, ROUTINE W REFLEX MICROSCOPIC
Bilirubin Urine: NEGATIVE
Leukocytes, UA: NEGATIVE
Nitrite: NEGATIVE
Specific Gravity, Urine: 1.008 (ref 1.005–1.030)
Urobilinogen, UA: 1 mg/dL (ref 0.0–1.0)
pH: 7 (ref 5.0–8.0)

## 2011-11-04 LAB — PROTIME-INR
INR: 1.18 (ref 0.00–1.49)
Prothrombin Time: 15.3 seconds — ABNORMAL HIGH (ref 11.6–15.2)

## 2011-11-04 LAB — CBC
Hemoglobin: 13.3 g/dL (ref 13.0–17.0)
Platelets: 308 10*3/uL (ref 150–400)
RBC: 4.52 MIL/uL (ref 4.22–5.81)
WBC: 6.5 10*3/uL (ref 4.0–10.5)

## 2011-11-04 LAB — DIFFERENTIAL
Eosinophils Absolute: 0.2 10*3/uL (ref 0.0–0.7)
Lymphocytes Relative: 25 % (ref 12–46)
Lymphs Abs: 1.6 10*3/uL (ref 0.7–4.0)
Monocytes Relative: 15 % — ABNORMAL HIGH (ref 3–12)
Neutro Abs: 3.7 10*3/uL (ref 1.7–7.7)
Neutrophils Relative %: 57 % (ref 43–77)

## 2011-11-04 LAB — CK TOTAL AND CKMB (NOT AT ARMC): Total CK: 57 U/L (ref 7–232)

## 2011-11-04 LAB — TROPONIN I: Troponin I: <0.3 ng/mL (ref <0.30)

## 2011-11-04 MED ORDER — CLOPIDOGREL BISULFATE 75 MG PO TABS: 75.0000 mg | ORAL_TABLET | Freq: Every day | ORAL | Status: DC

## 2011-11-04 MED ORDER — LISINOPRIL 20 MG PO TABS: 20.0000 mg | ORAL_TABLET | Freq: Every day | ORAL | Status: DC

## 2011-11-04 MED ORDER — LISINOPRIL 10 MG PO TABS
10.0000 mg | ORAL_TABLET | ORAL | Status: AC
  Administered 2011-11-04: 10 mg via ORAL
  Filled 2011-11-04 (×2): qty 1

## 2011-11-04 MED ORDER — SIMVASTATIN 20 MG PO TABS: 20.0000 mg | ORAL_TABLET | Freq: Every day | ORAL | Status: DC

## 2011-11-04 NOTE — ED Provider Notes (Signed)
Medical screening examination/treatment/procedure(s) were conducted as a shared visit with non-physician practitioner(s) and myself.  I personally evaluated the patient during the encounter  See previous note  Shelda Jakes, MD 11/04/11 1949

## 2011-11-04 NOTE — ED Provider Notes (Signed)
History     CSN: [id number] Arrival date & time: 11/04/2011  5:45 AM   First MD Initiated Contact with Patient 11/04/11 0559      Chief Complaint  Patient presents with  . Dizziness  . Hypertension    (Consider location/radiation/quality/duration/timing/severity/associated sxs/prior treatment) HPI Comments: Patient with history of stroke earlier this year, s/p left carotid endarterectomy -- presents with high blood pressure and the inability to ambulate. Patient endorses dizziness and vertigo for the past week but acutely worsened this morning. He states he was unable to get up and walk out of bed he told his wife to call an ambulance. Patient also states that his blood pressure has been running over 200 systolic in the past 2 days. He also states that he is legally blind and his vision is unchanged from baseline. He denies blackness or visual field cuts. Patient states she takes 2 aspirins every night. He states that he has been on blood pressure medications in the past however he is currently not taking this. Patient states that he has 1-2 episodes of chest pain per week however he is currently pain-free. Patient states he has a constant headache that is not worse today. Patient denies fever, URI symptoms, palpitations, abdominal pain, urinary symptoms.  Patient is a 76 y.o. male presenting with weakness. The history is provided by the patient.  Weakness The primary symptoms include headaches and dizziness. Primary symptoms do not include syncope, loss of consciousness, altered mental status, visual change, paresthesias, focal weakness, loss of sensation, speech change, memory loss, fever, nausea or vomiting. The symptoms began 3 to 5 days ago. The symptoms are worsening.  The headache is associated with weakness. The headache is not associated with visual change or paresthesias.  Dizziness also occurs with weakness. Dizziness does not occur with nausea or vomiting.  Additional symptoms  include weakness and vertigo. Workup history includes MRI and CT scan.    Past Medical History  Diagnosis Date  . Hypertension   . Peripheral vascular disease   . Legally blind   . Carotid artery occlusion   . CVA (cerebral infarction)     Past Surgical History  Procedure Date  . Carotid endarterectomy 04/30/2011    left    Family History  Problem Relation Age of Onset  . Cancer Mother   . Heart attack Father   . Heart attack Brother     History  Substance Use Topics  . Smoking status: Current Everyday Smoker -- 1.0 packs/day for 60 years    Types: Cigarettes  . Smokeless tobacco: Not on file  . Alcohol Use: No      Review of Systems  Constitutional: Negative for fever and chills.  HENT: Negative for sore throat and rhinorrhea.   Eyes: Negative for redness and visual disturbance.  Respiratory: Negative for shortness of breath.   Cardiovascular: Negative for chest pain, palpitations and syncope.  Gastrointestinal: Negative for nausea, vomiting, abdominal pain, diarrhea and constipation.  Genitourinary: Negative for dysuria.  Musculoskeletal: Negative for myalgias.  Skin: Negative for rash.  Neurological: Positive for dizziness, vertigo, weakness and headaches. Negative for speech change, focal weakness, loss of consciousness and paresthesias.  Psychiatric/Behavioral: Negative for memory loss and altered mental status.    Allergies  Review of patient's allergies indicates no known allergies.  Home Medications   Current Outpatient Rx  Name Route Sig Dispense Refill  . ASPIRIN EC 325 MG PO TBEC Oral Take 325 mg by mouth daily.  BP 198/116  Pulse 75  Temp(Src) 97.5 F (36.4 C) (Oral)  Resp 19  SpO2 99%  Physical Exam  Nursing note and vitals reviewed. Constitutional: He is oriented to person, place, and time. He appears well-developed and well-nourished.  HENT:  Head: Normocephalic and atraumatic.  Eyes: Conjunctivae are normal. Pupils are equal,  round, and reactive to light. Right eye exhibits no discharge. Left eye exhibits no discharge.  Neck: Normal range of motion. Neck supple.  Cardiovascular: Normal rate, regular rhythm and normal heart sounds.   Pulmonary/Chest: Effort normal and breath sounds normal. No respiratory distress. He has no wheezes.  Abdominal: Soft. Bowel sounds are normal. There is no tenderness. There is no rebound and no guarding.  Musculoskeletal: He exhibits no edema and no tenderness.  Neurological: He is alert and oriented to person, place, and time. He has normal strength. No cranial nerve deficit or sensory deficit. He exhibits normal muscle tone. He displays a negative Romberg sign. GCS eye subscore is 4. GCS verbal subscore is 5. GCS motor subscore is 6.  Skin: Skin is warm and dry.  Psychiatric: He has a normal mood and affect.    ED Course  Procedures (including critical care time)  Labs Reviewed  PROTIME-INR - Abnormal; Notable for the following:    Prothrombin Time 15.3 (*)    All other components within normal limits  APTT - Abnormal; Notable for the following:    aPTT 38 (*)    All other components within normal limits  DIFFERENTIAL - Abnormal; Notable for the following:    Monocytes Relative 15 (*)    All other components within normal limits  COMPREHENSIVE METABOLIC PANEL - Abnormal; Notable for the following:    Potassium 3.1 (*)    Glucose, Bld 105 (*)    Total Bilirubin 0.2 (*)    GFR calc non Af Amer 83 (*)    All other components within normal limits  GLUCOSE, CAPILLARY - Abnormal; Notable for the following:    Glucose-Capillary 115 (*)    All other components within normal limits  CBC  CK TOTAL AND CKMB  TROPONIN I  URINALYSIS, ROUTINE W REFLEX MICROSCOPIC  POCT CBG MONITORING  URINALYSIS, ROUTINE W REFLEX MICROSCOPIC  URINE CULTURE  URINE CULTURE   Ct Head Wo Contrast  11/04/2011  *RADIOLOGY REPORT*  Clinical Data: Dizziness, headache.  CT HEAD WITHOUT CONTRAST   Technique:  Contiguous axial images were obtained from the base of the skull through the vertex without contrast.  Comparison: 04/07/2011  Findings: There is atrophy and chronic small vessel disease changes.  Old lacunar infarcts in the right periventricular white matter. No acute intracranial abnormality.  Specifically, no hemorrhage, hydrocephalus, mass lesion, acute infarction, or significant intracranial injury.  No acute calvarial abnormality. Paranasal sinuses are clear.  Orbital soft tissues are unremarkable.  Right mastoid is clear.  The left mastoid air cells are opacified.  IMPRESSION: No acute intracranial abnormality.  Atrophy, chronic microvascular disease.  Original Report Authenticated By: Cyndie Chime, M.D.     1. Hypertension   2. Vertigo     6:01 AM 12/2010: CVA. His carotid duplex scan at that time showed 40-59% right carotid stenosis  and a greater than 80% left carotid stenosis.  04/2011: L carotid endarterectomy.    6:21 AM Patient seen and examined. BP 170/90.   Date: 11/04/2011  Rate: 76  Rhythm: normal sinus rhythm  QRS Axis: normal  Intervals: QT prolonged  ST/T Wave abnormalities: normal  Conduction  Disutrbances:none  Narrative Interpretation:   Old EKG Reviewed: none available  10:20 AM patient has remained stable with an exam that is unchanged. Patient has ambulated down the hallway without any trouble or assistance. I've spoken with Dr.Bezov with neurology. I will restart the patient on his previously prescribed blood pressure medications as well as cholesterol medications. I spoke with the patient at length about the need to be compliant with these medicines to lower his risk of having a stroke in the future. I urged the patient to find a primary care physician neck and follow him and adjust his medications appropriately. The patient verbalizes understanding and agrees with plan. Lisinopril given in the emergency department and current blood pressure is  150/90.  MDM  Patient with history of stroke presents with vertigo symptoms. CT is negative, workup was otherwise on concerning. Patient is ambulatory without difficulty and hallway. Doubt cerebellar or posterior circulation stroke. Neurology consult by telephone. Will restart blood pressure and cholesterol medications. Will have patient take Plavix per neurology consult on 03/22/2011. Patient counseled at length regarding medication compliance. Patient is stable for discharge to home.       Carolee Rota, [place] 11/04/11 1032

## 2011-11-04 NOTE — ED Notes (Signed)
Patient states he has been having trouble walking due to being dizzy and has noticed his BP has been up  Went to the drug store and his pressure was 213/112

## 2011-11-04 NOTE — ED Notes (Signed)
Dr. Deretha Emory at pt bedside at this time.

## 2011-11-04 NOTE — ED Notes (Signed)
Pharmacy called - dose of lisinopril has not been received on blue side as noted on MAR.

## 2011-11-04 NOTE — ED Provider Notes (Signed)
Patient with acute onset of dizziness the vertigo now improving head CT negative here in the emergency department patient's been noncompliant with hypertensive meds came in with very high blood pressure 190/116. Currently is feeling as if he is much better he could probably walk will probably give him a trial walking and discuss his overall situation with neurology in case MRI may be indicated to rule out intracranial etiology of the dizziness vertigo.   Medical screening examination/treatment/procedure(s) were conducted as a shared visit with non-physician practitioner(s) and myself.  I personally evaluated the patient during the encounter  Shelda Jakes, MD 11/04/11 [phone]

## 2011-11-04 NOTE — ED Notes (Signed)
Pt ambulated well but states continues to feel dizzy.  Westley Gambles, PA notified.

## 2011-11-04 NOTE — ED Notes (Signed)
Patient with dizziness, hypertensive, patient has had this for 8 months,  204/117 per EMS.  Patient having trouble ambulating this am due to the dizziness.  No nausea or vomiting, no CP or shortness of breath.

## 2011-11-04 NOTE — ED Notes (Signed)
Patient states that he has been having trouble with being dizzy for the past several months.  States that when he walks he sometimes walks into this but that could be due to being legally blind per patient.  Went to [employer] and took his BP and it was elevated and that scared him.  He stated he didn't want to have another stroke.  During the swallow screen he would have to push everything to the left side of his trouble  "Its like I cannot swallow on the right side of my throat".

## 2011-11-05 LAB — URINE CULTURE: Culture  Setup Time: 201212130954

## 2011-11-24 ENCOUNTER — Ambulatory Visit: Payer: Medicare Other | Admitting: Vascular Surgery

## 2011-11-24 ENCOUNTER — Other Ambulatory Visit: Payer: Medicare Other

## 2011-12-01 ENCOUNTER — Ambulatory Visit (INDEPENDENT_AMBULATORY_CARE_PROVIDER_SITE_OTHER): Payer: Medicare Other

## 2011-12-01 DIAGNOSIS — I6789 Other cerebrovascular disease: Secondary | ICD-10-CM

## 2011-12-01 DIAGNOSIS — J449 Chronic obstructive pulmonary disease, unspecified: Secondary | ICD-10-CM

## 2011-12-01 DIAGNOSIS — I10 Essential (primary) hypertension: Secondary | ICD-10-CM

## 2011-12-01 DIAGNOSIS — H6120 Impacted cerumen, unspecified ear: Secondary | ICD-10-CM

## 2011-12-01 DIAGNOSIS — E789 Disorder of lipoprotein metabolism, unspecified: Secondary | ICD-10-CM

## 2011-12-01 DIAGNOSIS — N410 Acute prostatitis: Secondary | ICD-10-CM

## 2012-01-30 ENCOUNTER — Emergency Department (HOSPITAL_COMMUNITY): Payer: Medicare Other

## 2012-01-30 ENCOUNTER — Other Ambulatory Visit: Payer: Self-pay

## 2012-01-30 ENCOUNTER — Inpatient Hospital Stay (HOSPITAL_COMMUNITY): Payer: Medicare Other

## 2012-01-30 ENCOUNTER — Inpatient Hospital Stay (HOSPITAL_COMMUNITY)
Admission: EM | Admit: 2012-01-30 | Discharge: 2012-02-01 | DRG: 062 | Disposition: A | Discharge status: Home / Self Care | Payer: Medicare Other | Attending: Neurology | Admitting: Neurology

## 2012-01-30 ENCOUNTER — Encounter (HOSPITAL_COMMUNITY): Payer: Self-pay

## 2012-01-30 DIAGNOSIS — I658 Occlusion and stenosis of other precerebral arteries: Secondary | ICD-10-CM | POA: Diagnosis present

## 2012-01-30 DIAGNOSIS — Z7902 Long term (current) use of antithrombotics/antiplatelets: Secondary | ICD-10-CM

## 2012-01-30 DIAGNOSIS — Z7982 Long term (current) use of aspirin: Secondary | ICD-10-CM

## 2012-01-30 DIAGNOSIS — I739 Peripheral vascular disease, unspecified: Secondary | ICD-10-CM | POA: Diagnosis present

## 2012-01-30 DIAGNOSIS — R29810 Facial weakness: Secondary | ICD-10-CM | POA: Diagnosis present

## 2012-01-30 DIAGNOSIS — I10 Essential (primary) hypertension: Secondary | ICD-10-CM | POA: Diagnosis present

## 2012-01-30 DIAGNOSIS — F17200 Nicotine dependence, unspecified, uncomplicated: Secondary | ICD-10-CM | POA: Diagnosis present

## 2012-01-30 DIAGNOSIS — G8190 Hemiplegia, unspecified affecting unspecified side: Secondary | ICD-10-CM | POA: Diagnosis present

## 2012-01-30 DIAGNOSIS — I6350 Cerebral infarction due to unspecified occlusion or stenosis of unspecified cerebral artery: Principal | ICD-10-CM | POA: Diagnosis present

## 2012-01-30 DIAGNOSIS — I6340 Cerebral infarction due to embolism of unspecified cerebral artery: Secondary | ICD-10-CM

## 2012-01-30 DIAGNOSIS — Z8673 Personal history of transient ischemic attack (TIA), and cerebral infarction without residual deficits: Secondary | ICD-10-CM

## 2012-01-30 DIAGNOSIS — Z79899 Other long term (current) drug therapy: Secondary | ICD-10-CM

## 2012-01-30 DIAGNOSIS — H548 Legal blindness, as defined in USA: Secondary | ICD-10-CM | POA: Diagnosis present

## 2012-01-30 HISTORY — DX: Personal history of other diseases of the digestive system: Z87.19

## 2012-01-30 HISTORY — DX: Cerebral infarction, unspecified: I63.9

## 2012-01-30 LAB — COMPREHENSIVE METABOLIC PANEL
ALT: 13 U/L (ref 0–53)
AST: 17 U/L (ref 0–37)
Alkaline Phosphatase: 94 U/L (ref 39–117)
CO2: 24 mEq/L (ref 19–32)
Chloride: 102 mEq/L (ref 96–112)
GFR calc non Af Amer: 61 mL/min — ABNORMAL LOW (ref >90)
Glucose, Bld: 100 mg/dL — ABNORMAL HIGH (ref 70–99)
Sodium: 137 mEq/L (ref 135–145)
Total Bilirubin: 0.2 mg/dL — ABNORMAL LOW (ref 0.3–1.2)

## 2012-01-30 LAB — DIFFERENTIAL
Basophils Absolute: 0.1 10*3/uL (ref 0.0–0.1)
Lymphocytes Relative: 37 % (ref 12–46)
Lymphs Abs: 3.6 10*3/uL (ref 0.7–4.0)
Neutro Abs: 4.5 10*3/uL (ref 1.7–7.7)

## 2012-01-30 LAB — POCT I-STAT, CHEM 8
Calcium, Ion: 1.15 mmol/L (ref 1.12–1.32)
Creatinine, Ser: 1.1 mg/dL (ref 0.50–1.35)
Glucose, Bld: 102 mg/dL — ABNORMAL HIGH (ref 70–99)
Hemoglobin: 13.9 g/dL (ref 13.0–17.0)
Sodium: 140 mEq/L (ref 135–145)
TCO2: 26 mmol/L (ref 0–100)

## 2012-01-30 LAB — APTT: aPTT: 36 seconds (ref 24–37)

## 2012-01-30 LAB — CBC
HCT: 39.4 % (ref 39.0–52.0)
Platelets: 292 10*3/uL (ref 150–400)
RBC: 4.28 MIL/uL (ref 4.22–5.81)
RDW: 14.4 % (ref 11.5–15.5)
WBC: 9.7 10*3/uL (ref 4.0–10.5)

## 2012-01-30 LAB — MRSA PCR SCREENING: MRSA by PCR: NEGATIVE

## 2012-01-30 LAB — PROTIME-INR: Prothrombin Time: 14.3 seconds (ref 11.6–15.2)

## 2012-01-30 MED ORDER — SIMVASTATIN 20 MG PO TABS
20.0000 mg | ORAL_TABLET | Freq: Every day | ORAL | Status: DC
  Administered 2012-01-31: 20 mg via ORAL
  Filled 2012-01-30 (×3): qty 1

## 2012-01-30 MED ORDER — NICARDIPINE HCL IN NACL 20-0.86 MG/200ML-% IV SOLN
3.0000 mg/h | INTRAVENOUS | Status: DC
  Administered 2012-01-30: 5 mg/h via INTRAVENOUS

## 2012-01-30 MED ORDER — ALTEPLASE (STROKE) FULL DOSE INFUSION
59.0000 mg | Freq: Once | INTRAVENOUS | Status: AC
  Administered 2012-01-30: 59 mg via INTRAVENOUS
  Filled 2012-01-30: qty 100

## 2012-01-30 MED ORDER — TAMSULOSIN HCL 0.4 MG PO CAPS
0.4000 mg | ORAL_CAPSULE | Freq: Every day | ORAL | Status: DC
  Administered 2012-01-31 – 2012-02-01 (×2): 0.4 mg via ORAL
  Filled 2012-01-30 (×2): qty 1

## 2012-01-30 MED ORDER — NICARDIPINE HCL IN NACL 20-0.86 MG/200ML-% IV SOLN
5.0000 mg/h | INTRAVENOUS | Status: DC
  Filled 2012-01-30 (×2): qty 200

## 2012-01-30 MED ORDER — ACETAMINOPHEN 325 MG PO TABS: 650.0000 mg | ORAL_TABLET | ORAL | Status: DC | PRN

## 2012-01-30 MED ORDER — SODIUM CHLORIDE 0.9 % IV SOLN
INTRAVENOUS | Status: DC
  Administered 2012-01-30 – 2012-01-31 (×2): via INTRAVENOUS

## 2012-01-30 MED ORDER — ONDANSETRON HCL 4 MG/2ML IJ SOLN: 4.0000 mg | Freq: Four times a day (QID) | INTRAMUSCULAR | Status: DC | PRN

## 2012-01-30 MED ORDER — SENNOSIDES-DOCUSATE SODIUM 8.6-50 MG PO TABS
1.0000 | ORAL_TABLET | Freq: Every evening | ORAL | Status: DC | PRN
  Filled 2012-01-30: qty 1

## 2012-01-30 MED ORDER — LABETALOL HCL 5 MG/ML IV SOLN
INTRAVENOUS | Status: AC
  Administered 2012-01-30: 10 mg via INTRAVENOUS
  Filled 2012-01-30: qty 4

## 2012-01-30 MED ORDER — IOHEXOL 350 MG/ML SOLN
50.0000 mL | Freq: Once | INTRAVENOUS | Status: AC | PRN
  Administered 2012-01-30: 50 mL via INTRAVENOUS

## 2012-01-30 MED ORDER — PANTOPRAZOLE SODIUM 40 MG IV SOLR
40.0000 mg | Freq: Every day | INTRAVENOUS | Status: DC
  Administered 2012-01-30: 40 mg via INTRAVENOUS
  Filled 2012-01-30 (×2): qty 40

## 2012-01-30 MED ORDER — LABETALOL HCL 5 MG/ML IV SOLN
10.0000 mg | INTRAVENOUS | Status: DC | PRN
  Administered 2012-01-30 (×2): 10 mg via INTRAVENOUS

## 2012-01-30 MED ORDER — ACETAMINOPHEN 650 MG RE SUPP: 650.0000 mg | RECTAL | Status: DC | PRN

## 2012-01-30 NOTE — ED Notes (Signed)
Pt was seen normal at 1700.

## 2012-01-30 NOTE — ED Notes (Signed)
Pharmacy was notified to mix tPA

## 2012-01-30 NOTE — ED Notes (Signed)
Phlebotomist arrived to the ER

## 2012-01-30 NOTE — ED Provider Notes (Signed)
History     CSN: [id number]  Arrival date & time 01/30/12  4098   First MD Initiated Contact with Patient 01/30/12 1859      Chief Complaint  Patient presents with  . Code Stroke    (Consider location/radiation/quality/duration/timing/severity/associated sxs/prior treatment) HPI Comments: Started 90 minutes pta with left facial droop, left arm and leg weakness, loss of balance.  Brought by EMS as a code stroke.    Patient is a 78 y.o. male presenting with weakness. The history is provided by the patient and the EMS personnel.  Weakness The primary symptoms include dizziness. Primary symptoms do not include headaches. The symptoms began 1 to 2 hours ago. The symptoms are unchanged. The neurological symptoms are focal. Context: none.  Dizziness also occurs with weakness.  Additional symptoms include weakness. Medical issues also include alcohol use. Medical issues do not include seizures or cerebral vascular accident.  Weakness The current episode started 1 to 2 hours ago. The problem has been unchanged. Associated symptoms include weakness. Pertinent negatives include no headaches.    Past Medical History  Diagnosis Date  . Hypertension   . Peripheral vascular disease   . Legally blind   . Carotid artery occlusion   . CVA (cerebral infarction)     Past Surgical History  Procedure Date  . Carotid endarterectomy 04/30/2011    left    Family History  Problem Relation Age of Onset  . Cancer Mother   . Heart attack Father   . Heart attack Brother     History  Substance Use Topics  . Smoking status: Current Everyday Smoker -- 1.0 packs/day for 65 years    Types: Cigarettes  . Smokeless tobacco: Not on file  . Alcohol Use: No      Review of Systems  Neurological: Positive for dizziness and weakness. Negative for headaches.  All other systems reviewed and are negative.    Allergies  Review of patient's allergies indicates no known allergies.  Home Medications     Current Outpatient Rx  Name Route Sig Dispense Refill  . ASPIRIN EC 325 MG PO TBEC Oral Take 650 mg by mouth daily.     [name] CLOPIDOGREL BISULFATE 75 MG PO TABS Oral Take 75 mg by mouth daily.    [name] METOPROLOL SUCCINATE ER 50 MG PO TB24 Oral Take 50 mg by mouth daily. Take with or immediately following a meal.    . SIMVASTATIN 20 MG PO TABS Oral Take 20 mg by mouth at bedtime.    . TAMSULOSIN HCL 0.4 MG PO CAPS Oral Take 0.4 mg by mouth daily.      BP 185/118  Pulse 90  Resp 18  Ht 5\' 5"  (1.651 m)  Wt 145 lb (65.772 kg)  BMI 24.13 kg/m2  SpO2 97%  Physical Exam  Nursing note and vitals reviewed. Constitutional: He is oriented to person, place, and time. He appears well-developed and well-nourished. No distress.  HENT:  Head: Normocephalic and atraumatic.  Right Ear: External ear normal.  Left Ear: External ear normal.  Eyes: EOM are normal. Pupils are equal, round, and reactive to light.  Neck: Normal range of motion.  Cardiovascular: Normal rate and regular rhythm.   No murmur heard. Pulmonary/Chest: Effort normal and breath sounds normal. No respiratory distress.  Abdominal: Soft. Bowel sounds are normal. He exhibits no distension.  Musculoskeletal: Normal range of motion.  Neurological: He is alert and oriented to person, place, and time.       The  patient is noted to have a left sided facial droop along with left arm and leg weakness.  LUE and LLE are 4+/5, the right 5/5.    Skin: Skin is warm and dry. He is not diaphoretic.    ED Course  Procedures (including critical care time)  Labs Reviewed  DIFFERENTIAL - Abnormal; Notable for the following:    Monocytes Relative 13 (*)    Monocytes Absolute 1.2 (*)    All other components within normal limits  POCT I-STAT, CHEM 8 - Abnormal; Notable for the following:    Glucose, Bld 102 (*)    All other components within normal limits  PROTIME-INR  APTT  CBC  COMPREHENSIVE METABOLIC PANEL  CK TOTAL AND CKMB  TROPONIN I    Ct Head Wo Contrast  01/30/2012  *RADIOLOGY REPORT*  Clinical Data: Code stroke.  Left facial droop and left-sided weakness.  CT HEAD WITHOUT CONTRAST  Technique:  Contiguous axial images were obtained from the base of the skull through the vertex without contrast.  Comparison: 11/04/2011.  Findings: Stable mild age related cerebral atrophy, ventriculomegaly and periventricular white matter disease.  No CT findings for acute hemispheric infarction and/or intracranial hemorrhage.  No extra-axial fluid collections are seen.  The bony structures are intact.  The paranasal sinuses are grossly clear.  Chronic left-sided mastoid disease is noted.  IMPRESSION:  1.  Age related cerebral atrophy, ventriculomegaly and periventricular white matter disease. 2.  No acute intracranial findings.  Original Report Authenticated By: P. Loralie Champagne, M.D.     No diagnosis found.    MDM  Code stroke continued.  Neuro in to see patient.  It appears as though they plan to pursue tpa once the blood pressure is controlled.        Geoffery Lyons, MD 02/08/12 1018

## 2012-01-30 NOTE — ED Notes (Signed)
Code Stroke was encoded and called

## 2012-01-30 NOTE — Code Documentation (Signed)
Code stroke called 1833, patient arrival to ED 1849, stroke team arrival 43, CT scan 1850, lab arrived at 32, TPA ordered 1908.  Patient was LSN at 1700, was with wife standing by microwave and left side became weak and numb, and developed a facial droop.  NIHSS 11.

## 2012-01-30 NOTE — ED Notes (Signed)
Was brought in by EMS, with sudden onset of lt facial droop with lt sided weakness onset at 1700.

## 2012-01-30 NOTE — ED Notes (Deleted)
Was

## 2012-01-30 NOTE — ED Notes (Signed)
Stroke team arrived to the ER

## 2012-01-30 NOTE — ED Notes (Signed)
Ten minutes after beginning TPA therapy, facial droop has began to resolve. Patient range of motion in left upper and lower extremities has improved significantly. Patient remains stable at this point, Pt is alertx4, NAD.

## 2012-01-30 NOTE — H&P (Addendum)
Admission H&P    Chief Complaint: Left sided weakness HPI: Jerry [name] is an 78 y.o. male who reports that he was with his wife beside the microwave and had acute onset left sided weakness.  Noted left facial droop and left sided numbness as well.  Initially the patient was unable to walk but has had some improvement in strength since initial presentation.  Continues to have significant left sided weakness.  Initial NIHSS of 11.    LSN: [id number] tPA Given: Yes mRankin: 0  Past Medical History  Diagnosis Date  . Hypertension   . Peripheral vascular disease   . Legally blind   . Carotid artery occlusion   . CVA (cerebral infarction)     Past Surgical History  Procedure Date  . Carotid endarterectomy 04/30/2011    left    Family History  Problem Relation Age of Onset  . Cancer Mother   . Heart attack Father   . Heart attack Brother    Social History:  reports that he has been smoking Cigarettes.  He has a 65 pack-year smoking history. He does not have any smokeless tobacco history on file. He reports that he does not drink alcohol or use illicit drugs.  Allergies: No Known Allergies  Medications Prior to Admission  Medication Dose Route Frequency Provider Last Rate Last Dose  . alteplase (ACTIVASE) 1 mg/mL infusion 59 mg  59 mg Intravenous Once Geoffery Lyons, MD      . labetalol (NORMODYNE,TRANDATE) 5 MG/ML injection            Medications Prior to Admission  Medication Sig Dispense Refill  . aspirin EC 325 MG tablet Take 650 mg by mouth daily.       . clopidogrel (PLAVIX) 75 MG tablet Take 75 mg by mouth daily.      . simvastatin (ZOCOR) 20 MG tablet Take 20 mg by mouth at bedtime.        ROS: History obtained from the patient  General ROS: negative for - chills, fatigue, fever, night sweats, weight gain or weight loss Psychological ROS: negative for - behavioral disorder, hallucinations, memory difficulties, mood swings or suicidal ideation Ophthalmic ROS: negative for -  blurry vision, double vision, eye pain or loss of vision ENT ROS: negative for - epistaxis, nasal discharge, oral lesions, sore throat, tinnitus or vertigo Allergy and Immunology ROS: negative for - hives or itchy/watery eyes Hematological and Lymphatic ROS: negative for - bleeding problems, bruising or swollen lymph nodes Endocrine ROS: negative for - galactorrhea, hair pattern changes, polydipsia/polyuria or temperature intolerance Respiratory ROS: negative for - cough, hemoptysis, shortness of breath or wheezing Cardiovascular ROS: chest pain secondary to previous surgery Gastrointestinal ROS: negative for - abdominal pain, diarrhea, hematemesis, nausea/vomiting or stool incontinence Genito-Urinary ROS: negative for - dysuria, hematuria, incontinence or urinary frequency/urgency Musculoskeletal ROS: left leg cramp Neurological ROS: as noted in HPI Dermatological ROS: negative for rash and skin lesion changes  Physical Examination: Blood pressure 185/118, pulse 90, resp. rate 18, height 5\' 5"  (1.651 m), weight 65.772 kg (145 lb), SpO2 97.00%.  HEENT-  Normocephalic, no lesions, without obvious abnormality.  Normal external eye and conjunctiva.  Normal TM's bilaterally.  Normal auditory canals and external ears. Normal external nose, mucus membranes and septum.  Normal pharynx. Neck supple with no masses, nodes, nodules or enlargement. Cardiovascular - S1, S2 normal Lungs - chest clear, no wheezing, rales, normal symmetric air entry Abdomen - soft, non-tender; bowel sounds normal; no masses,  no  organomegaly Extremities - no edema.  Purplish discontinuation of 2-3 toes on the left foot  Neurologic Examination: Mental Status: Alert, oriented, thought content appropriate.  Speech fluent without evidence of aphasia but dysarthric.  Able to follow 3 step commands without difficulty. Cranial Nerves: II: visual fields grossly normal, pupils equal, round, reactive to light and  accommodation III,IV, VI: ptosis not present, extra-ocular motions intact bilaterally V,VII: left facial droop, facial light touch sensation decreased on the left VIII: hearing normal bilaterally IX,X: gag reflex decreased XI: trapezius strength/neck flexion strength normal bilaterally XII: tongue strength normal  Motor: Right : Upper extremity   5/5    Left:     Upper extremity   3/5  Lower extremity   5/5     Lower extremity   4-/5 Tone and bulk:normal tone throughout; no atrophy noted Sensory: Pinprick and light touch decreased on the left Deep Tendon Reflexes: 2+ in the upper extremities, 3+ at the knees, 2+ at the right AJ, absent at the left AJ. Plantars: Right: downgoing   Left: upgoing Cerebellar: normal finger-to-nose and normal heel-to-shin test on the right.  Unable to perform on the left.     Results for orders placed during the hospital encounter of 01/30/12 (from the past 48 hour(s))  PROTIME-INR     Status: Normal   Collection Time   01/30/12  6:54 PM      Component Value Range Comment   Prothrombin Time 14.3  11.6 - 15.2 (seconds)    INR 1.09  0.00 - 1.49    APTT     Status: Normal   Collection Time   01/30/12  6:54 PM      Component Value Range Comment   aPTT 36  24 - 37 (seconds)   CBC     Status: Normal   Collection Time   01/30/12  6:54 PM      Component Value Range Comment   WBC 9.7  4.0 - 10.5 (K/uL)    RBC 4.28  4.22 - 5.81 (MIL/uL)    Hemoglobin 13.7  13.0 - 17.0 (g/dL)    HCT 45.4  09.8 - 11.9 (%)    MCV 92.1  78.0 - 100.0 (fL)    MCH 32.0  26.0 - 34.0 (pg)    MCHC 34.8  30.0 - 36.0 (g/dL)    RDW 14.7  82.9 - 56.2 (%)    Platelets 292  150 - 400 (K/uL)   DIFFERENTIAL     Status: Abnormal   Collection Time   01/30/12  6:54 PM      Component Value Range Comment   Neutrophils Relative 47  43 - 77 (%)    Neutro Abs 4.5  1.7 - 7.7 (K/uL)    Lymphocytes Relative 37  12 - 46 (%)    Lymphs Abs 3.6  0.7 - 4.0 (K/uL)    Monocytes Relative 13 (*) 3 - 12  (%)    Monocytes Absolute 1.2 (*) 0.1 - 1.0 (K/uL)    Eosinophils Relative 3  0 - 5 (%)    Eosinophils Absolute 0.3  0.0 - 0.7 (K/uL)    Basophils Relative 1  0 - 1 (%)    Basophils Absolute 0.1  0.0 - 0.1 (K/uL)   POCT I-STAT, CHEM 8     Status: Abnormal   Collection Time   01/30/12  7:15 PM      Component Value Range Comment   Sodium 140  135 - 145 (mEq/L)  Potassium 3.8  3.5 - 5.1 (mEq/L)    Chloride 103  96 - 112 (mEq/L)    BUN 9  6 - 23 (mg/dL)    Creatinine, Ser 1.61  0.50 - 1.35 (mg/dL)    Glucose, Bld 096 (*) 70 - 99 (mg/dL)    Calcium, Ion 0.45  1.12 - 1.32 (mmol/L)    TCO2 26  0 - 100 (mmol/L)    Hemoglobin 13.9  13.0 - 17.0 (g/dL)    HCT 40.9  81.1 - 91.4 (%)    Ct Head Wo Contrast  01/30/2012  *RADIOLOGY REPORT*  Clinical Data: Code stroke.  Left facial droop and left-sided weakness.  CT HEAD WITHOUT CONTRAST  Technique:  Contiguous axial images were obtained from the base of the skull through the vertex without contrast.  Comparison: 11/04/2011.  Findings: Stable mild age related cerebral atrophy, ventriculomegaly and periventricular white matter disease.  No CT findings for acute hemispheric infarction and/or intracranial hemorrhage.  No extra-axial fluid collections are seen.  The bony structures are intact.  The paranasal sinuses are grossly clear.  Chronic left-sided mastoid disease is noted.  IMPRESSION:  1.  Age related cerebral atrophy, ventriculomegaly and periventricular white matter disease. 2.  No acute intracranial findings.  Original Report Authenticated By: P. Loralie Champagne, M.D.    Assessment: 78 y.o. male presenting with left hemiparesis (left facial droop, left sided weakness and numbness).  Suspect right brain stroke. Multiple risk factors identified. Can not rule out a large vessel event by exam only.  Patient admits to no contraindications to tPA.  Risks and benefits discussed with patient.  Verbal consent obtained.  Malignant hypertension noted on  presentation as well.  Required 40mg  of Labetolol for control prior to tPA administration.    Stroke Risk Factors - carotid stenosis, hypertension, smoking and stroke in the past  Plan: 1. HgbA1c, fasting lipid panel 2. MRI of the brain without contrast 3. PT consult, OT consult, Speech consult 4. Echocardiogram 5. Repeat head CT in 24 hours 6. Prophylactic therapy-to be held at this time 7. Risk factor modification 8. Telemetry monitoring 9. Admission to 3100.  Patient going for CTA of head and neck prior to admission   Thana Farr, MD [facility] [phone] 01/30/2012, 7:24 PM   Addendum: CTA shows severe stenosis prox right vertebral artery, severe stenosis origin left vertebral artery, significant stenosis origin left CCA with ectatic left CCA, 12 mm moderate stenosis at the origin right CCA.  No signification intracranial cerebral artery occlusion.  AS changes of periclinoid ICA bilaterally.  No indication for interventional procedures at this time.  Patient to be admitted to 3100 with continued monitoring. This patient is critically ill and at significant risk of neurological worsening, death and care requires constant monitoring of vital signs, hemodynamics,respiratory and cardiac monitoring, neurological assessment, discussion with family, other specialists and medical decision making of high complexity. I spent 100 minutes of neurocritical care time  in the care of  this patient.  Thana Farr, MD [facility] [phone] 01/30/2012  9:03 PM

## 2012-01-30 NOTE — ED Notes (Signed)
Pt arrived in CT 

## 2012-01-30 NOTE — ED Notes (Signed)
Pt arrived to the ER and EDP exam was done

## 2012-01-31 ENCOUNTER — Inpatient Hospital Stay (HOSPITAL_COMMUNITY): Payer: Medicare Other

## 2012-01-31 ENCOUNTER — Encounter (HOSPITAL_COMMUNITY): Payer: Self-pay | Admitting: Radiology

## 2012-01-31 LAB — HEMOGLOBIN A1C
Hgb A1c MFr Bld: 6 % — ABNORMAL HIGH (ref <5.7)
Mean Plasma Glucose: 126 mg/dL — ABNORMAL HIGH (ref <117)

## 2012-01-31 LAB — LIPID PANEL
Cholesterol: 123 mg/dL (ref 0–200)
LDL Cholesterol: 52 mg/dL (ref 0–99)
Triglycerides: 128 mg/dL (ref <150)

## 2012-01-31 MED ORDER — ASPIRIN EC 325 MG PO TBEC
325.0000 mg | DELAYED_RELEASE_TABLET | Freq: Every day | ORAL | Status: DC
  Administered 2012-01-31 – 2012-02-01 (×2): 325 mg via ORAL
  Filled 2012-01-31 (×2): qty 1

## 2012-01-31 MED ORDER — PANTOPRAZOLE SODIUM 40 MG PO TBEC
40.0000 mg | DELAYED_RELEASE_TABLET | Freq: Every day | ORAL | Status: DC
  Administered 2012-01-31: 40 mg via ORAL
  Filled 2012-01-31: qty 1

## 2012-01-31 NOTE — Evaluation (Signed)
Clinical/Bedside Swallow Evaluation Patient Details  Name: STEPEN PRINS MRN: [id number] DOB: 1933/01/14 Today's Date: 01/31/2012  Past Medical History:  Past Medical History  Diagnosis Date  . Hypertension   . Peripheral vascular disease   . Legally blind   . Carotid artery occlusion   . CVA (cerebral infarction)   . Stroke april 2012  . H/O hiatal hernia    Past Surgical History:  Past Surgical History  Procedure Date  . Carotid endarterectomy 04/30/2011    left  . Back surgery   . Hernia repair    HPI:  78 y.o. male who reports that he was with his wife beside the microwave and had acute onset left sided weakness.  History of a right thalamic CVA in 02/2011 with right lingual numbness per patient report.  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  Patient reports this is "my normal way" since his CVA in 2012 and stated that he coughs about 25% of the time on liquids at home.  Review of 3 chest x-rays over the last year revealed no acute pulmonary process, indicating patient is probably tolerating any aspiration, although this SLP feels that patient may be expelling any laryngeal penetrate/aspirate fairly well given the immediacy and strength of his cough response.  Will resume per-admit diet with 1-2 skilled f/u sessions to ensure no new related issues.  Risk for Aspiration: Mild Other Related Risk Factors: History of dysphagia;Previous CVA  Swallow Evaluation Recommendations Solid Consistency: Regular Liquid Consistency: Thin Liquid Administration via: Cup Medication  Administration: Whole meds with liquid Supervision: Patient able to self feed;Intermittent supervision to cue for compensatory strategies Compensations: Small sips/bites Postural Changes and/or Swallow Maneuvers: Seated upright 90 degrees;Upright 30-60 min after meal Follow up Recommendations: None  Treatment Plan Treatment Plan Recommendations: Therapy as outlined in treatment plan below Speech Therapy Frequency: min 2x/week Treatment Duration: 1 week Interventions: Aspiration precaution training;Compensatory techniques;Patient/family education;Diet toleration management by SLP  Swallowing Goals  SLP Swallowing Goals Patient will utilize recommended strategies during swallow to increase swallowing safety with: Modified independent assistance  Myra Rude, M.S.,CCC-SLP Pager [phone]  01/31/2012,10:03 AM

## 2012-01-31 NOTE — Clinical Documentation Improvement (Addendum)
GENERIC DOCUMENTATION CLARIFICATION QUERY  THIS DOCUMENT IS NOT A PERMANENT PART OF THE MEDICAL RECORD  TO RESPOND TO THE THIS QUERY, FOLLOW THE INSTRUCTIONS BELOW:  1. If needed, update documentation for the patient's encounter via the notes activity.  2. Access this query again and click edit on the In [employer].  3. After updating, or not, click F2 to complete all highlighted (required) fields concerning your review. Select "additional documentation in the medical record" OR "no additional documentation provided".  4. Click Sign note button.  5. The deficiency will fall out of your In Basket *Please let us know if you are not able to complete this workflow by phone or e-mail (listed below).  Please update your documentation within the medical record to reflect your response to this query.                                                                                        01/31/12   Dear Dr.Ciria Bernardini / Associates,  In a better effort to capture your patient's severity of illness, reflect appropriate length of stay and utilization of resources, a review of the patient medical record has revealed the following indicators.   Based on your clinical judgment, please clarify and document in a progress note and/or discharge summary the clinical condition associated with the following supporting information: In responding to this query please exercise your independent judgment.  The fact that a query is asked, does not imply that any particular answer is desired or expected.  In the patient's H&P, it was stated that the patients Left Upper extremity 3/5 and Left Lower extremity 4-/5. Please consider the below (if your clinical findings/judgment agree) as you document the patient's diagnosis/condition(s) in the progress note and discharge summary. An addendum to the H&P is also appropriate.  Thank you!  Possible Clinical Conditions?  - Left hemiparesis  - Left sided weakness  - Other  condition (please document in the progress notes and/or discharge summary)  - Cannot Clinically determine at this time   Supporting Information:  - H&P: Chief Complaint: Left sided weakness  HPI: Jerry [name] is an 78 y.o. male who reports that he was with his wife beside the microwave and had acute onset left sided weakness. Noted left facial droop and left sided numbness as well. Initially the patient was unable to walk but has had some improvement in strength since initial presentation. Continues to have significant left sided weakness. Initial NIHSS of 11.    Reviewed: additional documentation in the medical record  Thank You,  Saul Fordyce  Clinical Documentation Specialist: [phone] Pager  Health Information Management [place]

## 2012-01-31 NOTE — Evaluation (Signed)
Speech Language Pathology Evaluation Patient Details Name: Jerry [name] MRN: [id number] DOB: May 09, 1933 Today's Date: 01/31/2012  Problem List:  Patient Active Problem List  Diagnoses  . Cerebral embolism with cerebral infarction  . Hemiplegia, unspecified, affecting nondominant side  . Hypertension   Past Medical History:  Past Medical History  Diagnosis Date  . Hypertension   . Peripheral vascular disease   . Legally blind   . Carotid artery occlusion   . CVA (cerebral infarction)   . Stroke april 2012  . H/O hiatal hernia    Past Surgical History:  Past Surgical History  Procedure Date  . Carotid endarterectomy 04/30/2011    left  . Back surgery   . Hernia repair     SLP Assessment/Plan/Recommendation Assessment Clinical Impression Statement: Demonstrates functional cognitive-linguistic abilities for basic wants/needs.  No evidence of new changes, altough baseline is ot completely clear at this time as wife not available. SLP Recommendation/Assessment: Patient does not need any further Speech Lanaguage Pathology Services No Skilled Speech Therapy: All education completed;Patient at baseline level of functioning Plan Speech Therapy Frequency: min 2x/week SLP Recommendations Follow up Recommendations: None  Myra Rude, M.S.,CCC-SLP Pager [phone] 01/31/2012, 10:09 AM

## 2012-01-31 NOTE — Progress Notes (Signed)
PT/OT Cancellation Note  Treatment cancelled today due to medical issues with patient which prohibited therapy.  Pt on bedrest for 24 hours due to TPA given.  Will hold PT/OT evaluation until tomorrow.  Thanks!!!  [name] 01/31/2012, 8:22 AM Pager:  [phone]

## 2012-01-31 NOTE — Progress Notes (Signed)
Subjective:  Jerry [name] is an 78 y.o. male who reports that he was with his wife beside the microwave and had acute onset left sided weakness. Noted left facial droop and left sided numbness as well. Initially the patient was unable to walk but has had some improvement in strength since initial presentation.  LSN: [id number]  tPA Given: Yes  mRankin: 0 He had done well overnight with near complete resolution of left sided deficits. Has poor vision at baseline but no other deficits. BP has been well controlled. No new complaints this am. He has had rt brain stroke 2 years ago with similar presentation in past and had lt carotid endarterectomy. Objective: Vital signs in last 24 hours: Temp:  [97.9 F (36.6 C)-98.6 F (37 C)] 98.6 F (37 C) (03/11 0400) Pulse Rate:  [66-93] 66  (03/11 0700) Resp:  [15-28] 15  (03/11 0700) BP: (104-193)/(46-118) 104/46 mmHg (03/11 0700) SpO2:  [95 %-100 %] 95 % (03/11 0700) Weight:  [65.1 kg (143 lb 8.3 oz)-65.772 kg (145 lb)] 65.1 kg (143 lb 8.3 oz) (03/10 2100) Weight change:     Intake/Output from previous day: 03/10 0701 - 03/11 0700 In: 787.8 [I.V.:777.8; IV Piggyback:10] Out: 1500 [Urine:1500] Intake/Output this shift:    Physical Exam : pleasant elderly male not in diostress, afebrile. Cardiac exam no murmur or gallop. Lungs clear to auscultation. Abdomen soft nontender. Carotid endarterectomy scar on left Neurological exam ; Awake  Alert oriented x 3. Normal speech and language.eye movements full without nystagmus. Face symmetric. Tongue midline. Normal strength, tone, reflexes and coordination. Normal sensation. Gait deferred. Mild diminished fine finger movements on left and orbits right over left upper extremity. Mild weakness left grip. Lab Results:  Basename 01/30/12 1915 01/30/12 1854  WBC -- 9.7  HGB 13.9 13.7  HCT 41.0 39.4  PLT -- 292   BMET  Basename 01/30/12 1915 01/30/12 1854  NA 140 137  K 3.8 3.8  CL 103 102  CO2 -- 24    GLUCOSE 102* 100*  BUN 9 9  CREATININE 1.10 1.14  CALCIUM -- 9.1    Studies/Results: Ct Angio Head W/cm &/or Wo Cm  01/31/2012  *RADIOLOGY REPORT*  Clinical Data:  Left-sided weakness and facial droop.  Code stroke.  CT ANGIOGRAPHY HEAD AND NECK  Technique:  Multidetector CT imaging of the head and neck was performed using the standard protocol during bolus administration of intravenous contrast.  Multiplanar CT image reconstructions including MIPs were obtained to evaluate the vascular anatomy. Carotid stenosis measurements (when applicable) are obtained utilizing NASCET criteria, using the distal internal carotid diameter as the denominator.  Contrast: 50mL OMNIPAQUE IOHEXOL 350 MG/ML IV SOLN  Comparison:  Most recent CT head earlier today.  Most recent prior MR 03/22/2011.  Prior CTA 04/07/2011.  CTA NECK  Findings:  Extensive plaque aortic arch with irregularity. Moderate narrowing proximal left common carotid artery.  Moderate narrowing right common carotid artery origin.  Severe ostial stenosis left vertebral best seen on coronal imaging likely flow reducing.  Severe stenosis right vertebral 1 cm above the origin likely flow reducing.  Previous left carotid endarterectomy with widely patent lumen.  No residual stenosis observed.  Nonstenotic atheromatous change right internal carotid artery origin (2.7/3.9 ratio).  No significant external carotid artery stenosis right or left.  Moderate C6-7 disc space narrowing with previous C5-6 fusion. Cervical facet disease.  Scarring lung apices.  Progression of proximal vertebral artery disease compared with the prior CTA study from 2012.  Review of the MIP images confirms the above findings.  IMPRESSION: No residual stenosis following left carotid endarterectomy.  Severe stenoses at the proximal right and left vertebrals, likely flow reducing.  Moderate stenoses at the origin of the left and right common carotid arteries.  CTA HEAD  Findings:  Chronic  periopercular infarct on the right with encephalomalacia more evident than on  prior CTs.  Remote right cingulate gyrus infarct.  Remote right thalamic lacune. Heavily calcified plaque involves both cavernous internal carotid arteries without  flow reducing lesion.  Small bulge along the lateral aspect of the left internal carotid artery cavernous segment redemonstrated, likely atheromatous change.  Small infundibulum left posterior communicating artery redemonstrated and stable.  No supraclinoid ICA stenosis.  No proximal ACA, MCA, or PCA disease. No intracranial berry aneurysm.  Basilar artery widely patent with right greater than left vertebral contributions.  No cerebellar branch occlusion.  Post contrast, no abnormal intracranial enhancement.  Chronic mastoid disease left temporal bone.  Calvarium intact.  Sinuses clear.   Review of the MIP images confirms the above findings.  IMPRESSION: Chronic changes as described.  No acute vascular occlusion or flow reducing extracranial/intracranial stenosis.  Original Report Authenticated By: Elsie Stain, M.D.   Dg Chest 2 View  01/30/2012  *RADIOLOGY REPORT*  Clinical Data: Cough  CHEST - 2 VIEW  Comparison: 04/29/2011  Findings: Normal heart size.  Clear lungs.  No pleural effusion. Intact thoracic spine.  Osteopenia.  No pneumothorax.  IMPRESSION: No active cardiopulmonary disease.  Original Report Authenticated By: Donavan Burnet, M.D.   Ct Head Wo Contrast  01/30/2012  *RADIOLOGY REPORT*  Clinical Data: Code stroke.  Left facial droop and left-sided weakness.  CT HEAD WITHOUT CONTRAST  Technique:  Contiguous axial images were obtained from the base of the skull through the vertex without contrast.  Comparison: 11/04/2011.  Findings: Stable mild age related cerebral atrophy, ventriculomegaly and periventricular white matter disease.  No CT findings for acute hemispheric infarction and/or intracranial hemorrhage.  No extra-axial fluid collections are seen.   The bony structures are intact.  The paranasal sinuses are grossly clear.  Chronic left-sided mastoid disease is noted.  IMPRESSION:  1.  Age related cerebral atrophy, ventriculomegaly and periventricular white matter disease. 2.  No acute intracranial findings.  Original Report Authenticated By: P. Loralie Champagne, M.D.   Ct Angio Neck W/cm &/or Wo/cm  01/31/2012  *RADIOLOGY REPORT*  Clinical Data:  Left-sided weakness and facial droop.  Code stroke.  CT ANGIOGRAPHY HEAD AND NECK  Technique:  Multidetector CT imaging of the head and neck was performed using the standard protocol during bolus administration of intravenous contrast.  Multiplanar CT image reconstructions including MIPs were obtained to evaluate the vascular anatomy. Carotid stenosis measurements (when applicable) are obtained utilizing NASCET criteria, using the distal internal carotid diameter as the denominator.  Contrast: 50mL OMNIPAQUE IOHEXOL 350 MG/ML IV SOLN  Comparison:  Most recent CT head earlier today.  Most recent prior MR 03/22/2011.  Prior CTA 04/07/2011.  CTA NECK  Findings:  Extensive plaque aortic arch with irregularity. Moderate narrowing proximal left common carotid artery.  Moderate narrowing right common carotid artery origin.  Severe ostial stenosis left vertebral best seen on coronal imaging likely flow reducing.  Severe stenosis right vertebral 1 cm above the origin likely flow reducing.  Previous left carotid endarterectomy with widely patent lumen.  No residual stenosis observed.  Nonstenotic atheromatous change right internal carotid artery origin (2.7/3.9 ratio).  No significant external  carotid artery stenosis right or left.  Moderate C6-7 disc space narrowing with previous C5-6 fusion. Cervical facet disease.  Scarring lung apices.  Progression of proximal vertebral artery disease compared with the prior CTA study from 2012.   Review of the MIP images confirms the above findings.  IMPRESSION: No residual stenosis  following left carotid endarterectomy.  Severe stenoses at the proximal right and left vertebrals, likely flow reducing.  Moderate stenoses at the origin of the left and right common carotid arteries.  CTA HEAD  Findings:  Chronic periopercular infarct on the right with encephalomalacia more evident than on  prior CTs.  Remote right cingulate gyrus infarct.  Remote right thalamic lacune. Heavily calcified plaque involves both cavernous internal carotid arteries without  flow reducing lesion.  Small bulge along the lateral aspect of the left internal carotid artery cavernous segment redemonstrated, likely atheromatous change.  Small infundibulum left posterior communicating artery redemonstrated and stable.  No supraclinoid ICA stenosis.  No proximal ACA, MCA, or PCA disease. No intracranial berry aneurysm.  Basilar artery widely patent with right greater than left vertebral contributions.  No cerebellar branch occlusion.  Post contrast, no abnormal intracranial enhancement.  Chronic mastoid disease left temporal bone.  Calvarium intact.  Sinuses clear.   Review of the MIP images confirms the above findings.  IMPRESSION: Chronic changes as described.  No acute vascular occlusion or flow reducing extracranial/intracranial stenosis.  Original Report Authenticated By: Elsie Stain, M.D.    MRI examination of the brain pending     Medications: I have reviewed the patient's current medications.  Assessment/Plan: 78 year male with right brain subcortical infarct s/p IV TPA with excellent clinical recovery. P/H/o rt thalamic infarct and left carotid endarterectomy.  Plan ; strict control of HT with BP goal below 160/90. Check MRI, ECHo, dopplers. Lipids and HbA1c. PT.OT consults. Mobilize OOB.Aspirin after f/u Ct/MRI negative for bleed.This patient is critically ill and at significant risk of neurological worsening, death and care requires constant monitoring of vital signs, hemodynamics,respiratory and  cardiac monitoring, neurological assessment, discussion with family, other specialists and medical decision making of high complexity. I spent 30 minutes of neurocritical care time  in the care of  this patient.   LOS: 1 day   Inaya Gillham,PRAMODKUMAR P 01/31/2012, 9:25 AM

## 2012-01-31 NOTE — Progress Notes (Signed)
  Echocardiogram 2D Echocardiogram has been performed.  Cathie Beams Deneen 01/31/2012, 8:49 AM

## 2012-01-31 NOTE — Progress Notes (Signed)
OT Cancellation Note   Treatment cancelled today due to medical issues with patient which prohibited therapy. Pt on bedrest for 24 hours due to TPA given. Will hold OT evaluation until tomorrow. Thanks!!!     Lucile Shutters   OTR/L Pager: [phone] Office: [phone] .

## 2012-01-31 NOTE — Clinical Documentation Improvement (Signed)
Hypertension Documentation Clarification Query  THIS DOCUMENT IS NOT A PERMANENT PART OF THE MEDICAL RECORD  TO RESPOND TO THE THIS QUERY, FOLLOW THE INSTRUCTIONS BELOW:  1. If needed, update documentation for the patient's encounter via the notes activity.  2. Access this query again and click edit on the In [employer].  3. After updating, or not, click F2 to complete all highlighted (required) fields concerning your review. Select "additional documentation in the medical record" OR "no additional documentation provided".  4. Click Sign note button.  5. The deficiency will fall out of your In Basket *Please let us know if you are not able to complete this workflow by phone or e-mail (listed below).        01/31/12  Dear Dr. Thad Ranger Marton Redwood  In an effort to better capture your patient's severity of illness, reflect appropriate length of stay and utilization of resources, a review of the patient medical record has revealed the following indicators.   Based on your clinical judgment, please clarify and document in a progress note and/or discharge summary the clinical condition associated with the following supporting information: In responding to this query please exercise your independent judgment.  The fact that a query is asked, does not imply that any particular answer is desired or expected.  "Initially BP elevated. Required 40mg  of Labetolol for control prior to tPA administration." was stated in the H&P. Please consider the below (if your clinical findings/judgment agree) as you document the patient's diagnosis/condition(s) in the progress note and discharge summary. An addendum to the H&P is also appropriate. Thank you!  Possible Clinical Conditions?   - Accelerated Hypertension  (POA?)  - Malignant Hypertension  (POA?)  - Other condition (please document in the progress notes and/or discharge summary)  - Cannot Clinically determine at this time   Supporting  Information:  - niCARdipine (CARDENE-IV) infusion (0.1 mg/ml) ordered 01/30/12  - labetalol (NORMODYNE,TRANDATE) injection 10 mg ordered 01/30/12  - B/P's on admission: 185/118, 172,118, 175/105    Reviewed: additional documentation in the medical record  Thank Darden Palmer  Clinical Documentation Specialist: [phone] Pager  Health Information Management [place]

## 2012-01-31 NOTE — Progress Notes (Signed)
Brief Nutrition Note:  Pt identified on Nutrition Risk report for dysphagia. Pt admitted for stroke, s/p TPA. Spoke with pt who reports he does not have any dysphagia PTA, good appetite, no recent weight changes. 65 inches, 143 lbs with BMI of 23.9 (WNL). Pt seen by SLP and is now on Regular diet. No nutrition issues identified at this time. Please re-consult as needed.  Kendell Bane Cornelison [phone]

## 2012-02-01 NOTE — Discharge Summary (Signed)
Stroke Discharge Summary  Patient ID: Jerry [name]   MRN: [id number]      DOB: Aug 27, 1933  Date of Admission: 01/30/2012 Date of Discharge: 02/01/2012  Attending Physician:  Darcella Cheshire, MD  Consulting Physician(s):     none  Patient's PCP:  Pearson Grippe, MD, MD  Discharge Diagnoses:  Principal Problem:  *Cerebral embolism with cerebral infarction - right brain subcortical infarct status post IV t-PA. Infarct secondary to small vessel disease   Mild upper extremity hemiparesis, affecting nondominant side  Malignant Hypertension on admission  old right brain stroke 2 yrs ago with similar presentation  L CEA 2 yrs ago, remains patent  bilateral vertebral artery stenosis  mild right CCA stenosis   BMI: Body mass index is 23.44 kg/(m^2).  Past Medical History  Diagnosis Date  . Hypertension   . Peripheral vascular disease   . Legally blind   . Carotid artery occlusion   . CVA (cerebral infarction)   . Stroke april 2012  . H/O hiatal hernia    Past Surgical History  Procedure Date  . Carotid endarterectomy 04/30/2011    left  . Back surgery   . Hernia repair    Medication List  As of 02/01/2012  6:13 PM   STOP taking these medications         aspirin EC 325 MG tablet         TAKE these medications         clopidogrel 75 MG tablet   Commonly known as: PLAVIX   Take 75 mg by mouth daily.      metoprolol succinate 50 MG 24 hr tablet   Commonly known as: TOPROL-XL   Take 50 mg by mouth daily. Take with or immediately following a meal.      simvastatin 20 MG tablet   Commonly known as: ZOCOR   Take 20 mg by mouth at bedtime.      Tamsulosin HCl 0.4 MG Caps   Commonly known as: FLOMAX   Take 0.4 mg by mouth daily.           LABORATORY STUDIES CBC    Component Value Date/Time   WBC 9.7 01/30/2012 1854   RBC 4.28 01/30/2012 1854   HGB 13.9 01/30/2012 1915   HCT 41.0 01/30/2012 1915   PLT 292 01/30/2012 1854   MCV 92.1 01/30/2012 1854   MCH 32.0  01/30/2012 1854   MCHC 34.8 01/30/2012 1854   RDW 14.4 01/30/2012 1854   LYMPHSABS 3.6 01/30/2012 1854   MONOABS 1.2* 01/30/2012 1854   EOSABS 0.3 01/30/2012 1854   BASOSABS 0.1 01/30/2012 1854   CMP    Component Value Date/Time   NA 140 01/30/2012 1915   K 3.8 01/30/2012 1915   CL 103 01/30/2012 1915   CO2 24 01/30/2012 1854   GLUCOSE 102* 01/30/2012 1915   BUN 9 01/30/2012 1915   CREATININE 1.10 01/30/2012 1915   CALCIUM 9.1 01/30/2012 1854   PROT 7.5 01/30/2012 1854   ALBUMIN 4.0 01/30/2012 1854   AST 17 01/30/2012 1854   ALT 13 01/30/2012 1854   ALKPHOS 94 01/30/2012 1854   BILITOT 0.2* 01/30/2012 1854   GFRNONAA 61* 01/30/2012 1854   GFRAA 70* 01/30/2012 1854   COAGS Lab Results  Component Value Date   INR 1.09 01/30/2012   INR 1.18 11/04/2011   INR 1.08 04/29/2011   Lipid Panel    Component Value Date/Time   CHOL 123 01/31/2012 0443  TRIG 128 01/31/2012 0443   HDL 45 01/31/2012 0443   CHOLHDL 2.7 01/31/2012 0443   VLDL 26 01/31/2012 0443   LDLCALC 52 01/31/2012 0443   HgbA1C  Lab Results  Component Value Date   HGBA1C 6.0* 01/31/2012   Urine Drug Screen  No results found for this basename: labopia, cocainscrnur, labbenz, amphetmu, thcu, labbarb    Alcohol Level No results found for this basename: eth   SIGNIFICANT DIAGNOSTIC STUDIES - Ct Angio Head W/cm &/or Wo Cm 01/31/2012  Chronic changes as described.  No acute vascular occlusion or flow reducing extracranial/intracranial stenosis.    - Dg Chest 2 View 01/30/2012 No active cardiopulmonary disease.     - Ct Head Wo Contrast 02/01/2012  Known right posterior caudate body/corona radiata infarction from recent MRI. No hemorrhage or acute change identified by CT.  01/30/2012  1.  Age related cerebral atrophy, ventriculomegaly and periventricular white matter disease. 2.  No acute intracranial findings.    - Ct Angio Neck W/cm &/or Wo/cm 3/11/2013bbNo residual stenosis following left carotid endarterectomy.  Severe stenoses at the  proximal right and left vertebrals, likely flow reducing.  Moderate stenoses at the origin of the left and right common carotid arteries.   - Mr Brain Wo Contrast 01/31/2012  1.  Acute non hemorrhagic linear infarcts in the posterior right caudate or corona radiata. 2.  Remote linear infarcts versus dilated perivascular spaces in the right corona radiata. 3.  Remote lacunar infarcts of the basal ganglia and right cerebellum. 4.  Bilateral mastoid effusions.  No obstructing nasopharyngeal lesion is evident. 5.  Slow or occluded flow within the right vertebral artery.   - Mr Maxine Glenn Head/brain Wo Cm 01/31/2012   1.  Moderate small vessel disease. 2.  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 3.  Atherosclerotic irregularities within the cavernous carotid arteries bilaterally.  No significant stenosis is evident. 4.  Slow or occluded flow within the distal right vertebral artery.       History of Present Illness Jerry [name] is an 78 y.o. male who reports that he was with his wife beside the microwave and had acute onset left sided weakness 01/30/2012 at 1700. Noted left facial droop and left sided numbness as well. Initially the patient was unable to walk but has had some improvement in strength since initial presentation. BP 185/118 in the ED requiring treatment with labetalol prior to administration of IV t-PA. He was placed on a cardene drip for ongoing BP control. CTA done in the ED showed no treatable large vessel occlusion. He was admitted to the neuro ICU for further evaluation and treatment.  Hospital Course Patient tolerated t-PA without difficulty. Post t-PA imaging showed no hemorrhage. BP was well controlled on cardene. Once able to take pos, he was changed to oral antihypertensives with ongoing control. He  had excellent clinical recovery with only very mild hemiparesis in left hand.   Patient has stroke risk factors of carotid stenosis, hyperlipidemia and hypertension  Physical therapy,  occupational therapy and speech therapy evaluated patient. No therapy followup was recommended.  Discharge Exam  Blood pressure 147/94, pulse 71, temperature 97.9 F (36.6 C), temperature source Oral, resp. rate 21, height 5\' 5"  (1.651 m), weight 63.9 kg (140 lb 14 oz), SpO2 99.00%. Physical Exam: pleasant elderly male not in diostress, afebrile. Cardiac exam no murmur or gallop. Lungs clear to auscultation. Abdomen soft nontender. Carotid endarterectomy scar on left. Neurological exam ; Awake Alert oriented x 3. Normal speech  and language.eye movements full without nystagmus. Face symmetric. Tongue midline. Normal strength, tone, reflexes and coordination. Normal sensation. Gait deferred. Mild diminished fine finger movements on left and orbits right over left upper extremity. Mild weakness left grip.  Discharge Diet     heart healthy thin liquids  Discharge Plan   - Disposition:  01-Home or Self Care - aspirin 325 mg orally every day for secondary stroke prevention. - Ongoing risk factor control by Primary Care Physician. - Risk factor recommendations:  Hypertension target range 130-140/70-80 Lipid range - LDL < 100 and checked every 6 months, fasting  - Follow-up Pearson Grippe, MD, MD in 1 month. - Follow-up with Dr. Delia Heady in 2 months.  Signed Annie Main, AVNP, ANP-BC, [facility] Nurse Practitioner 02/01/2012, 6:13 PM  Dr. Delia Heady, Stroke Center Medical Director, has personally reviewed chart, pertinent data, examined the patient and developed the plan of care.

## 2012-02-01 NOTE — Progress Notes (Signed)
Speech Language/Pathology Attempted check of pts diet tolerance, pt preparing to discharge. Pt reports no change in function, reports that he does sometimes cough and "thats just his way". Harlon Ditty, MA CCC-SLP [phone]

## 2012-02-01 NOTE — Evaluation (Signed)
Physical Therapy Evaluation Patient Details Name: Jerry [name] MRN: [id number] DOB: Jan 18, 1933 Today's Date: 02/01/2012  Problem List:  Patient Active Problem List  Diagnoses  . Cerebral embolism with cerebral infarction  . Hemiplegia, unspecified, affecting nondominant side  . Hypertension    Past Medical History:  Past Medical History  Diagnosis Date  . Hypertension   . Peripheral vascular disease   . Legally blind   . Carotid artery occlusion   . CVA (cerebral infarction)   . Stroke april 2012  . H/O hiatal hernia    Past Surgical History:  Past Surgical History  Procedure Date  . Carotid endarterectomy 04/30/2011    left  . Back surgery   . Hernia repair     PT Assessment/Plan/Recommendation PT Assessment Clinical Impression Statement: Patient is a 78 y.o. male s/p R CVA with resulting L hemiparesis that resolved with tPa administration.  Patient demonstrates impaired judgment and impulsiveness.  Patient able to ambulate with supervision due to impaired judgment. PT Recommendation/Assessment: Patent does not need any further PT services No Skilled PT: Patient at baseline level of functioning PT Recommendation Follow Up Recommendations: No PT follow up Equipment Recommended: None recommended by PT PT Goals     PT Evaluation Precautions/Restrictions  Precautions Precautions: Other (comment) (legally blind) Prior Functioning  Home Living Lives With: Family Type of Home: House Home Layout: One level Home Access: Stairs to enter Entrance Stairs-Rails: Can reach both Entrance Stairs-Number of Steps: 2 Bathroom Shower/Tub: Engineer, manufacturing systems: Standard Bathroom Accessibility: Yes Home Adaptive Equipment: None Prior Function Level of Independence: Independent with gait;Independent with transfers;Independent with basic ADLs Able to Take Stairs?: Yes Driving: No Vocation: On disability Cognition Cognition Arousal/Alertness:  Awake/alert Orientation Level: Oriented X4 Sensation/Coordination Sensation Light Touch: Appears Intact Proprioception: Appears Intact Coordination Gross Motor Movements are Fluid and Coordinated: Yes Extremity Assessment   Mobility (including Balance) Bed Mobility Bed Mobility: Yes Sit to Supine: 7: Independent Transfers Transfers: Yes Sit to Stand: 7: Independent Stand to Sit: 7: Independent Ambulation/Gait Ambulation/Gait: Yes Ambulation/Gait Assistance: 5: Supervision Ambulation/Gait Assistance Details (indicate cue type and reason): Patient requires supervision due to impaired judgment and in order to slow down. Ambulation Distance (Feet): 300 Feet Assistive device: None Gait Pattern: Within Functional Limits Gait velocity: Increased Stairs: Yes Stairs Assistance: 7: Independent Stair Management Technique: Alternating pattern;One rail Left;Forwards Number of Stairs: 15  Wheelchair Mobility Wheelchair Mobility: No  Posture/Postural Control Posture/Postural Control: No significant limitations Dynamic Gait Index Level Surface: Normal Change in Gait Speed: Mild Impairment Gait with Horizontal Head Turns: Mild Impairment Gait with Vertical Head Turns: Normal Gait and Pivot Turn: Normal Step Over Obstacle: Normal Step Around Obstacles: Normal Steps: Normal Total Score: 22  Exercise    End of Session PT - End of Session Equipment Utilized During Treatment: Gait belt Activity Tolerance: Patient tolerated treatment well Patient left: in bed;Other (comment) (with hospital technician) General Behavior During Session: Agitated Cognition: Impaired, at baseline (Patient displayed impaired judgment and impulsiveness.)  Ezzard Standing SPT 02/01/2012, 9:09 AM

## 2012-02-01 NOTE — Progress Notes (Signed)
Stroke Team Progress Note  HISTORY Jerry [name] is an 78 y.o. male who reports that he was with his wife beside the microwave and had acute onset left sided weakness. Noted left facial droop and left sided numbness as well. Initially the patient was unable to walk but has had some improvement in strength since initial presentation.   SUBJECTIVE Pt anxious to go home. PT to evaluate this am. He is keen to go on today. He has no residual deficits  OBJECTIVE Filed Vitals:   02/01/12 0600 02/01/12 0700 02/01/12 0755 02/01/12 0757  BP:  169/89 174/80 164/95  Pulse: 69 68 74   Temp:   97.9 F (36.6 C)   TempSrc:   Oral   Resp: 19  19   Height:      Weight:      SpO2: 96% 95% 97%     CBG (last 3) No results found for this basename: GLUCAP:3 in the last 72 hours Intake/Output from previous day: 03/11 0701 - 03/12 0700 In: 2370 [P.O.:720; I.V.:1650] Out: 2100 [Urine:2100]  IV Fluid Intake:     . sodium chloride 75 mL/hr at 01/31/12 0440  . niCARDipine Stopped (01/30/12 2135)   Medications    . aspirin EC  325 mg Oral Daily  . pantoprazole  40 mg Oral Q1200  . simvastatin  20 mg Oral QHS  . Tamsulosin HCl  0.4 mg Oral Daily  . DISCONTD: pantoprazole (PROTONIX) IV  40 mg Intravenous QHS  PRN acetaminophen, acetaminophen, labetalol, ondansetron (ZOFRAN) IV, senna-docusate  Diet:  General thin liquids Activity:  Up with assistance DVT Prophylaxis:  SCDs  Significant Diagnostic Studies: CBC    Component Value Date/Time   WBC 9.7 01/30/2012 1854   RBC 4.28 01/30/2012 1854   HGB 13.9 01/30/2012 1915   HCT 41.0 01/30/2012 1915   PLT 292 01/30/2012 1854   MCV 92.1 01/30/2012 1854   MCH 32.0 01/30/2012 1854   MCHC 34.8 01/30/2012 1854   RDW 14.4 01/30/2012 1854   LYMPHSABS 3.6 01/30/2012 1854   MONOABS 1.2* 01/30/2012 1854   EOSABS 0.3 01/30/2012 1854   BASOSABS 0.1 01/30/2012 1854   CMP    Component Value Date/Time   NA 140 01/30/2012 1915   K 3.8 01/30/2012 1915   CL 103  01/30/2012 1915   CO2 24 01/30/2012 1854   GLUCOSE 102* 01/30/2012 1915   BUN 9 01/30/2012 1915   CREATININE 1.10 01/30/2012 1915   CALCIUM 9.1 01/30/2012 1854   PROT 7.5 01/30/2012 1854   ALBUMIN 4.0 01/30/2012 1854   AST 17 01/30/2012 1854   ALT 13 01/30/2012 1854   ALKPHOS 94 01/30/2012 1854   BILITOT 0.2* 01/30/2012 1854   GFRNONAA 61* 01/30/2012 1854   GFRAA 70* 01/30/2012 1854   COAGS Lab Results  Component Value Date   INR 1.09 01/30/2012   INR 1.18 11/04/2011   INR 1.08 04/29/2011   Lipid Panel    Component Value Date/Time   CHOL 123 01/31/2012 0443   TRIG 128 01/31/2012 0443   HDL 45 01/31/2012 0443   CHOLHDL 2.7 01/31/2012 0443   VLDL 26 01/31/2012 0443   LDLCALC 52 01/31/2012 0443   HgbA1C  Lab Results  Component Value Date   HGBA1C 6.0* 01/31/2012   Urine Drug Screen  No results found for this basename: labopia, cocainscrnur, labbenz, amphetmu, thcu, labbarb    Alcohol Level No results found for this basename: eth     No results found for this or any  previous visit (from the past 24 hour(s)).  Ct Angio Head W/cm &/or Wo Cm  01/31/2012  *RADIOLOGY REPORT*  Clinical Data:  Left-sided weakness and facial droop.  Code stroke.  CT ANGIOGRAPHY HEAD AND NECK  Technique:  Multidetector CT imaging of the head and neck was performed using the standard protocol during bolus administration of intravenous contrast.  Multiplanar CT image reconstructions including MIPs were obtained to evaluate the vascular anatomy. Carotid stenosis measurements (when applicable) are obtained utilizing NASCET criteria, using the distal internal carotid diameter as the denominator.  Contrast: 50mL OMNIPAQUE IOHEXOL 350 MG/ML IV SOLN  Comparison:  Most recent CT head earlier today.  Most recent prior MR 03/22/2011.  Prior CTA 04/07/2011.  CTA NECK  Findings:  Extensive plaque aortic arch with irregularity. Moderate narrowing proximal left common carotid artery.  Moderate narrowing right common carotid artery  origin.  Severe ostial stenosis left vertebral best seen on coronal imaging likely flow reducing.  Severe stenosis right vertebral 1 cm above the origin likely flow reducing.  Previous left carotid endarterectomy with widely patent lumen.  No residual stenosis observed.  Nonstenotic atheromatous change right internal carotid artery origin (2.7/3.9 ratio).  No significant external carotid artery stenosis right or left.  Moderate C6-7 disc space narrowing with previous C5-6 fusion. Cervical facet disease.  Scarring lung apices.  Progression of proximal vertebral artery disease compared with the prior CTA study from 2012.   Review of the MIP images confirms the above findings.  IMPRESSION: No residual stenosis following left carotid endarterectomy.  Severe stenoses at the proximal right and left vertebrals, likely flow reducing.  Moderate stenoses at the origin of the left and right common carotid arteries.  CTA HEAD  Findings:  Chronic periopercular infarct on the right with encephalomalacia more evident than on  prior CTs.  Remote right cingulate gyrus infarct.  Remote right thalamic lacune. Heavily calcified plaque involves both cavernous internal carotid arteries without  flow reducing lesion.  Small bulge along the lateral aspect of the left internal carotid artery cavernous segment redemonstrated, likely atheromatous change.  Small infundibulum left posterior communicating artery redemonstrated and stable.  No supraclinoid ICA stenosis.  No proximal ACA, MCA, or PCA disease. No intracranial berry aneurysm.  Basilar artery widely patent with right greater than left vertebral contributions.  No cerebellar branch occlusion.  Post contrast, no abnormal intracranial enhancement.  Chronic mastoid disease left temporal bone.  Calvarium intact.  Sinuses clear.   Review of the MIP images confirms the above findings.  IMPRESSION: Chronic changes as described.  No acute vascular occlusion or flow reducing  extracranial/intracranial stenosis.  Original Report Authenticated By: Elsie Stain, M.D.   Dg Chest 2 View  01/30/2012  *RADIOLOGY REPORT*  Clinical Data: Cough  CHEST - 2 VIEW  Comparison: 04/29/2011  Findings: Normal heart size.  Clear lungs.  No pleural effusion. Intact thoracic spine.  Osteopenia.  No pneumothorax.  IMPRESSION: No active cardiopulmonary disease.  Original Report Authenticated By: Donavan Burnet, M.D.   Ct Head Wo Contrast  02/01/2012  *RADIOLOGY REPORT*  Clinical Data: Stroke status post t-PA  CT HEAD WITHOUT CONTRAST  Technique:  Contiguous axial images were obtained from the base of the skull through the vertex without contrast.  Comparison: 01/31/2012 MRI  Findings: Periventricular and subcortical white matter hypodensities are most in keeping with chronic microangiopathic change.  Infarction along the posterior caudate body on the right is poorly visualized by CT. There is no evidence for acute hemorrhage, hydrocephalus,  mass lesion, or abnormal extra-axial fluid collection.  No definite CT evidence for additional infarction.  IMPRESSION: Known right posterior caudate body/corona radiata infarction from recent MRI. No hemorrhage or acute change identified by CT.  Original Report Authenticated By: Waneta Martins, M.D.   Ct Head Wo Contrast  01/30/2012  *RADIOLOGY REPORT*  Clinical Data: Code stroke.  Left facial droop and left-sided weakness.  CT HEAD WITHOUT CONTRAST  Technique:  Contiguous axial images were obtained from the base of the skull through the vertex without contrast.  Comparison: 11/04/2011.  Findings: Stable mild age related cerebral atrophy, ventriculomegaly and periventricular white matter disease.  No CT findings for acute hemispheric infarction and/or intracranial hemorrhage.  No extra-axial fluid collections are seen.  The bony structures are intact.  The paranasal sinuses are grossly clear.  Chronic left-sided mastoid disease is noted.  IMPRESSION:  1.   Age related cerebral atrophy, ventriculomegaly and periventricular white matter disease. 2.  No acute intracranial findings.  Original Report Authenticated By: P. Loralie Champagne, M.D.   Ct Angio Neck W/cm &/or Wo/cm  01/31/2012  *RADIOLOGY REPORT*  Clinical Data:  Left-sided weakness and facial droop.  Code stroke.  CT ANGIOGRAPHY HEAD AND NECK  Technique:  Multidetector CT imaging of the head and neck was performed using the standard protocol during bolus administration of intravenous contrast.  Multiplanar CT image reconstructions including MIPs were obtained to evaluate the vascular anatomy. Carotid stenosis measurements (when applicable) are obtained utilizing NASCET criteria, using the distal internal carotid diameter as the denominator.  Contrast: 50mL OMNIPAQUE IOHEXOL 350 MG/ML IV SOLN  Comparison:  Most recent CT head earlier today.  Most recent prior MR 03/22/2011.  Prior CTA 04/07/2011.  CTA NECK  Findings:  Extensive plaque aortic arch with irregularity. Moderate narrowing proximal left common carotid artery.  Moderate narrowing right common carotid artery origin.  Severe ostial stenosis left vertebral best seen on coronal imaging likely flow reducing.  Severe stenosis right vertebral 1 cm above the origin likely flow reducing.  Previous left carotid endarterectomy with widely patent lumen.  No residual stenosis observed.  Nonstenotic atheromatous change right internal carotid artery origin (2.7/3.9 ratio).  No significant external carotid artery stenosis right or left.  Moderate C6-7 disc space narrowing with previous C5-6 fusion. Cervical facet disease.  Scarring lung apices.  Progression of proximal vertebral artery disease compared with the prior CTA study from 2012.   Review of the MIP images confirms the above findings.  IMPRESSION: No residual stenosis following left carotid endarterectomy.  Severe stenoses at the proximal right and left vertebrals, likely flow reducing.  Moderate stenoses at  the origin of the left and right common carotid arteries.  CTA HEAD  Findings:  Chronic periopercular infarct on the right with encephalomalacia more evident than on  prior CTs.  Remote right cingulate gyrus infarct.  Remote right thalamic lacune. Heavily calcified plaque involves both cavernous internal carotid arteries without  flow reducing lesion.  Small bulge along the lateral aspect of the left internal carotid artery cavernous segment redemonstrated, likely atheromatous change.  Small infundibulum left posterior communicating artery redemonstrated and stable.  No supraclinoid ICA stenosis.  No proximal ACA, MCA, or PCA disease. No intracranial berry aneurysm.  Basilar artery widely patent with right greater than left vertebral contributions.  No cerebellar branch occlusion.  Post contrast, no abnormal intracranial enhancement.  Chronic mastoid disease left temporal bone.  Calvarium intact.  Sinuses clear.   Review of the MIP images confirms the above  findings.  IMPRESSION: Chronic changes as described.  No acute vascular occlusion or flow reducing extracranial/intracranial stenosis.  Original Report Authenticated By: Elsie Stain, M.D.   Mr Brain Wo Contrast  01/31/2012  *RADIOLOGY REPORT*  Clinical Data:  Left-sided paralysis yesterday with near complete resolution following TPA.  Stroke.  MRI HEAD WITHOUT CONTRAST MRA HEAD WITHOUT CONTRAST  Technique:  Multiplanar, multiecho pulse sequences of the brain and surrounding structures were obtained without intravenous contrast. Angiographic images of the head were obtained using MRA technique without contrast.  Comparison:  CT head without contrast 01/30/2012.  CTA head and neck 01/30/2012.  MRI HEAD  Findings:  The diffusion weighted images demonstrate an acute non hemorrhagic infarct within the posterior caudate or corona radiata adjacent to the right lateral ventricle.  Mild T2 and FLAIR hyperintensities associated with this infarct.  No other focal  acute infarct is evident.  A remote cortical infarct is present superficial to this area. Dilated perivascular dilated perivascular spaces versus remote linear infarcts are evident in the right corona radiata. Although this has a serpiginous appearance of the vessel, there is no definite flow on the T2 sequences.  Periventricular and subcortical white matter changes are evident bilaterally.  Remote lacunar infarcts are present within the basal ganglia bilaterally as well as the right cerebellum.  No hemorrhage or mass lesion is present.  There is poor flow signal within the right vertebral artery.  The remaining intracranial arteries demonstrate flow.  The patient is status post bilateral lens extractions.  The paranasal sinuses are clear.  Bilateral mastoid effusions are present.  No obstructing nasopharyngeal lesion is evident.  IMPRESSION:  1.  Acute non hemorrhagic linear infarcts in the posterior right caudate or corona radiata. 2.  Remote linear infarcts versus dilated perivascular spaces in the right corona radiata. 3.  Remote lacunar infarcts of the basal ganglia and right cerebellum. 4.  Bilateral mastoid effusions.  No obstructing nasopharyngeal lesion is evident. 5.  Slow or occluded flow within the right vertebral artery.  MRA HEAD  Findings: The time-of-flight images demonstrate poor signal within the distal right vertebral artery, suggesting slow or occluded flow.  This may have been a dominant vessel.  The left vertebral artery is intact.  The basilar artery is small, but without focal stenosis.  Both posterior cerebral arteries originate from basilar tip.  There is attenuation of distal PCA branch vessels.  Irregularities noted within the cavernous carotid arteries bilaterally without significant focal stenosis.  A 2 cm aneurysm is present in the left posterior communicating artery.  The left A1 segment is dominant.  No focal stenosis is present within the A1 or M1 segments bilaterally.  The anterior  communicating artery is patent.  The MCA bifurcations are within normal limits.  There is significant attenuation of distal MCA branch vessels bilaterally.  IMPRESSION:  1.  Moderate small vessel disease. 2.  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 3.  Atherosclerotic irregularities within the cavernous carotid arteries bilaterally.  No significant stenosis is evident. 4.  Slow or occluded flow within the distal right vertebral artery.  Original Report Authenticated By: Jamesetta Orleans. MATTERN, M.D.   Mr Maxine Glenn Head/brain Wo Cm  01/31/2012  *RADIOLOGY REPORT*  Clinical Data:  Left-sided paralysis yesterday with near complete resolution following TPA.  Stroke.  MRI HEAD WITHOUT CONTRAST MRA HEAD WITHOUT CONTRAST  Technique:  Multiplanar, multiecho pulse sequences of the brain and surrounding structures were obtained without intravenous contrast. Angiographic images of the head were obtained  using MRA technique without contrast.  Comparison:  CT head without contrast 01/30/2012.  CTA head and neck 01/30/2012.  MRI HEAD  Findings:  The diffusion weighted images demonstrate an acute non hemorrhagic infarct within the posterior caudate or corona radiata adjacent to the right lateral ventricle.  Mild T2 and FLAIR hyperintensities associated with this infarct.  No other focal acute infarct is evident.  A remote cortical infarct is present superficial to this area. Dilated perivascular dilated perivascular spaces versus remote linear infarcts are evident in the right corona radiata. Although this has a serpiginous appearance of the vessel, there is no definite flow on the T2 sequences.  Periventricular and subcortical white matter changes are evident bilaterally.  Remote lacunar infarcts are present within the basal ganglia bilaterally as well as the right cerebellum.  No hemorrhage or mass lesion is present.  There is poor flow signal within the right vertebral artery.  The remaining intracranial arteries  demonstrate flow.  The patient is status post bilateral lens extractions.  The paranasal sinuses are clear.  Bilateral mastoid effusions are present.  No obstructing nasopharyngeal lesion is evident.  IMPRESSION:  1.  Acute non hemorrhagic linear infarcts in the posterior right caudate or corona radiata. 2.  Remote linear infarcts versus dilated perivascular spaces in the right corona radiata. 3.  Remote lacunar infarcts of the basal ganglia and right cerebellum. 4.  Bilateral mastoid effusions.  No obstructing nasopharyngeal lesion is evident. 5.  Slow or occluded flow within the right vertebral artery.  MRA HEAD  Findings: The time-of-flight images demonstrate poor signal within the distal right vertebral artery, suggesting slow or occluded flow.  This may have been a dominant vessel.  The left vertebral artery is intact.  The basilar artery is small, but without focal stenosis.  Both posterior cerebral arteries originate from basilar tip.  There is attenuation of distal PCA branch vessels.  Irregularities noted within the cavernous carotid arteries bilaterally without significant focal stenosis.  A 2 cm aneurysm is present in the left posterior communicating artery.  The left A1 segment is dominant.  No focal stenosis is present within the A1 or M1 segments bilaterally.  The anterior communicating artery is patent.  The MCA bifurcations are within normal limits.  There is significant attenuation of distal MCA branch vessels bilaterally.  IMPRESSION:  1.  Moderate small vessel disease. 2.  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 3.  Atherosclerotic irregularities within the cavernous carotid arteries bilaterally.  No significant stenosis is evident. 4.  Slow or occluded flow within the distal right vertebral artery.  Original Report Authenticated By: Jamesetta Orleans. MATTERN, M.D.   2D Echocardiogram  EF 65-70%. No SOE.  Carotid Doppler  Right 40-59%, no left stenosis  Physical Exam: pleasant  elderly male not in diostress, afebrile. Cardiac exam no murmur or gallop. Lungs clear to auscultation. Abdomen soft nontender. Carotid endarterectomy scar on left   Neurological exam ; Awake Alert oriented x 3. Normal speech and language.eye movements full without nystagmus. Face symmetric. Tongue midline.  Normal strength, tone, reflexes and coordination. Normal sensation. Gait deferred. Mild diminished fine finger movements on left and orbits right over left upper extremity. Mild weakness left grip.  ASSESSMENT Jerry [name] is a 78 y.o. male with a right brain subcortical infarct s/p IV TPA with excellent clinical recovery  secondary to small vessel disease. On aspirin 325 mg orally every day for secondary stroke prevention.  No therapy needs  Hospital day # 2  TREATMENT/PLAN Plan discharge later today. Followup as outpatient  Annie Main, AVNP, ANP-BC, GNP-BC [facility] Pager: [phone] 02/01/2012 8:25 AM  Dr. Delia Heady, Stroke Center Medical Director, has personally reviewed chart, pertinent data, examined the patient and developed the plan of care.

## 2012-02-01 NOTE — Discharge Instructions (Signed)
STROKE/TIA DISCHARGE INSTRUCTIONS SMOKING Cigarette smoking nearly doubles your risk of having a stroke & is the single most alterable risk factor  If you smoke or have smoked in the last 12 months, you are advised to quit smoking for your health.  Most of the excess cardiovascular risk related to smoking disappears within a year of stopping.  Ask you doctor about anti-smoking medications  [place] Quit Line: 1-800-QUIT NOW  Free Smoking Cessation Classes [phone]  CHOLESTEROL Know your levels; limit fat & cholesterol in your diet  Lipid Panel     Component Value Date/Time   CHOL 123 01/31/2012 0443   TRIG 128 01/31/2012 0443   HDL 45 01/31/2012 0443   CHOLHDL 2.7 01/31/2012 0443   VLDL 26 01/31/2012 0443   LDLCALC 52 01/31/2012 0443      Many patients benefit from treatment even if their cholesterol is at goal.  Goal: Total Cholesterol (CHOL) less than 160  Goal:  Triglycerides (TRIG) less than 150  Goal:  HDL greater than 40  Goal:  LDL (LDLCALC) less than 100   BLOOD PRESSURE American Stroke Association blood pressure target is less that 120/80 mm/Hg  Your discharge blood pressure is:  BP: 147/94 mmHg  Monitor your blood pressure  Limit your salt and alcohol intake  Many individuals will require more than one medication for high blood pressure  DIABETES (A1c is a blood sugar average for last 3 months) Goal HGBA1c is under 7% (HBGA1c is blood sugar average for last 3 months)  Diabetes: No known diagnosis of diabetes    Lab Results  Component Value Date   HGBA1C 6.0* 01/31/2012     Your HGBA1c can be lowered with medications, healthy diet, and exercise.  Check your blood sugar as directed by your physician  Call your physician if you experience unexplained or low blood sugars.  PHYSICAL ACTIVITY/REHABILITATION Goal is 30 minutes at least 4 days per week    Activity:   No restrictions. Therapies:   {STROKE DC THERAPIES:22361} Return to work:  n/a  Activity decreases  your risk of heart attack and stroke and makes your heart stronger.  It helps control your weight and blood pressure; helps you relax and can improve your mood.  Participate in a regular exercise program.  Talk with your doctor about the best form of exercise for you (dancing, walking, swimming, cycling).  DIET/WEIGHT Goal is to maintain a healthy weight  Your discharge diet is: General thin liquids Your height is:  Height: 5\' 5"  (165.1 cm) Your current weight is: Weight: 63.9 kg (140 lb 14 oz) Your Body Mass Index (BMI) is:  BMI (Calculated): 23.9   Following the type of diet specifically designed for you will help prevent another stroke.  Your goal weight range is:  ***  Your goal Body Mass Index (BMI) is 19-24.  Healthy food habits can help reduce 3 risk factors for stroke:  High cholesterol, hypertension, and excess weight.  RESOURCES Stroke/Support Group:  Call [phone]  they meet the 3rd Sunday of the month on the Rehab Unit at [facility], [place] ( no meetings June, July & Aug).  STROKE EDUCATION PROVIDED/REVIEWED AND GIVEN TO PATIENT Stroke warning signs and symptoms How to activate emergency medical system (call 911). Medications prescribed at discharge. Need for follow-up after discharge. Personal risk factors for stroke. Pneumonia vaccine given:   {STROKE DC YES/NO/DATE:22363} Flu vaccine given:   {STROKE DC YES/NO/DATE:22363} My questions have been answered, the writing is legible, and I  understand these instructions.  I will adhere to these goals & educational materials that have been provided to me after my discharge from the hospital.

## 2012-02-01 NOTE — Evaluation (Signed)
Occupational Therapy Evaluation Patient Details Name: Jerry [name] MRN: [id number] DOB: 1933-02-01 Today's Date: 02/01/2012  Problem List:  Patient Active Problem List  Diagnoses  . Cerebral embolism with cerebral infarction  . Hemiplegia, unspecified, affecting nondominant side  . Hypertension    Past Medical History:  Past Medical History  Diagnosis Date  . Hypertension   . Peripheral vascular disease   . Legally blind   . Carotid artery occlusion   . CVA (cerebral infarction)   . Stroke april 2012  . H/O hiatal hernia    Past Surgical History:  Past Surgical History  Procedure Date  . Carotid endarterectomy 04/30/2011    left  . Back surgery   . Hernia repair     OT Assessment/Plan/Recommendation OT Assessment Clinical Impression Statement: 78 yo male s/p CVA with impulsive behavior. Pt currently does not require skilled OT . pt is at [facility] level for d/c home OT Recommendation/Assessment: Patient does not need any further OT services OT Recommendation Follow Up Recommendations: No OT follow up Equipment Recommended: None recommended by OT OT Goals    OT Evaluation Precautions/Restrictions  Precautions Precautions:  (legally blind) Prior Functioning Home Living Lives With: Family Type of Home: House Home Layout: One level Home Access: Stairs to enter Entrance Stairs-Rails: Can reach both Entrance Stairs-Number of Steps: 2 Bathroom Shower/Tub: Engineer, manufacturing systems: Standard Bathroom Accessibility: Yes Home Adaptive Equipment: None Prior Function Level of Independence: Independent with gait;Independent with transfers;Independent with basic ADLs Able to Take Stairs?: Yes Driving: No Vocation: On disability Comments: 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  ADL ADL Upper Body Dressing: Performed;Modified independent Where Assessed - Upper Body Dressing: Sitting, bed;Unsupported Lower Body Dressing: Performed;Modified independent Where  Assessed - Lower Body Dressing: Sit to stand from bed Toilet Transfer: Simulated;Modified independent Toilet Transfer Method: Proofreader: Regular height toilet Tub/Shower Transfer: Simulated;Modified independent (using trash can on its side) Tub/Shower Transfer Method: Ambulating Ambulation Related to ADLs: Pt ambulating impulsive and required v/c to decrease speed. pt  (Pt demonstrating stairs and ability to navigate narrow space) ADL Comments: Pt completed portions of the DGI and no balance deficits noted. Pt able to navigate objects in walk way though pt reports "i am legally blind" pt able to see shadows and avoid tripping hazards. Pt completed dressing in prep for d/c home. Pt impulsive and joking during session. Pt pleasant and reports "i am leaving today I am ready to go home" Vision/Perception  Vision - History Baseline Vision: Legally blind Cognition Cognition Arousal/Alertness: Awake/alert Overall Cognitive Status: Appears within functional limits for tasks assessed Orientation Level: Oriented X4 Cognition - Other Comments: impulsive behavior Sensation/Coordination Sensation Light Touch: Appears Intact Proprioception: Appears Intact Coordination Gross Motor Movements are Fluid and Coordinated: Yes Fine Motor Movements are Fluid and Coordinated: Yes Extremity Assessment RUE Assessment RUE Assessment: Within Functional Limits LUE Assessment LUE Assessment: Within Functional Limits Mobility  Bed Mobility Bed Mobility: Yes Sit to Supine: 7: Independent Transfers Transfers: Yes Sit to Stand: 7: Independent Stand to Sit: 7: Independent Exercises   End of Session OT - End of Session Equipment Utilized During Treatment: Gait belt Activity Tolerance: Patient tolerated treatment well Patient left: in bed (caroid doppler being performed at end of session) Nurse Communication: Mobility status for transfers;Mobility status for  ambulation General Behavior During Session: [facility] for tasks performed Cognition: Impaired   Lucile Shutters 02/01/2012, 3:45 PM  Pager: [phone]

## 2012-02-01 NOTE — Progress Notes (Signed)
*  PRELIMINARY RESULTS* Vascular Ultrasound Carotid Duplex (Doppler) has been completed.  Preliminary findings: Right= 40-59% ICA stenosis, low end of scale, with antegrade vertebral flow. Left= No significant ICA stenosis with antegrade vertebral flow. S/P Left endarterectomy.   Farrel Demark RDMS 02/01/2012, 9:14 AM

## 2012-02-01 NOTE — Evaluation (Signed)
Agree with Student PT evaluation.  [place], [place] DPT [phone]

## 2012-02-04 NOTE — ED Provider Notes (Signed)
History     CSN: [id number]  Arrival date & time 01/30/12  4098   First MD Initiated Contact with Patient 01/30/12 1859      Chief Complaint  Patient presents with  . Code Stroke    (Consider location/radiation/quality/duration/timing/severity/associated sxs/prior treatment) Patient is a 78 y.o. male presenting with weakness. The history is provided by the patient.  Weakness The primary symptoms include focal weakness. The symptoms began 1 to 2 hours ago. The symptoms are unchanged. The neurological symptoms are focal. Context: none.  Additional symptoms include weakness.    Past Medical History  Diagnosis Date  . Hypertension   . Peripheral vascular disease   . Legally blind   . Carotid artery occlusion   . CVA (cerebral infarction)   . Stroke april 2012  . H/O hiatal hernia     Past Surgical History  Procedure Date  . Carotid endarterectomy 04/30/2011    left  . Back surgery   . Hernia repair     Family History  Problem Relation Age of Onset  . Cancer Mother   . Heart attack Father   . Heart attack Brother     History  Substance Use Topics  . Smoking status: Current Everyday Smoker -- 1.0 packs/day for 65 years    Types: Cigarettes  . Smokeless tobacco: Never Used  . Alcohol Use: No      Review of Systems  Neurological: Positive for focal weakness and weakness.  All other systems reviewed and are negative.    Allergies  Review of patient's allergies indicates no known allergies.  Home Medications   Current Outpatient Rx  Name Route Sig Dispense Refill  . CLOPIDOGREL BISULFATE 75 MG PO TABS Oral Take 75 mg by mouth daily.    [name] METOPROLOL SUCCINATE ER 50 MG PO TB24 Oral Take 50 mg by mouth daily. Take with or immediately following a meal.    . SIMVASTATIN 20 MG PO TABS Oral Take 20 mg by mouth at bedtime.    . TAMSULOSIN HCL 0.4 MG PO CAPS Oral Take 0.4 mg by mouth daily.      BP 147/94  Pulse 71  Temp(Src) 97.9 F (36.6 C) (Oral)  Resp 21   Ht 5\' 5"  (1.651 m)  Wt 140 lb 14 oz (63.9 kg)  BMI 23.44 kg/m2  SpO2 99%  Physical Exam  Nursing note and vitals reviewed. Constitutional: He is oriented to person, place, and time. He appears well-developed and well-nourished. No distress.  HENT:  Head: Normocephalic and atraumatic.  Eyes: EOM are normal. Pupils are equal, round, and reactive to light.  Neck: Normal range of motion. Neck supple.  Cardiovascular: Normal rate, regular rhythm and normal heart sounds.   No murmur heard. Pulmonary/Chest: Effort normal and breath sounds normal. No respiratory distress.  Abdominal: Soft. Bowel sounds are normal. He exhibits no distension. There is no tenderness.  Musculoskeletal: Normal range of motion. He exhibits no edema.  Neurological: He is alert and oriented to person, place, and time. Coordination normal.       There is a left sided facial droop noted.  There is also 4+/5 strength in the LUE and LLE.    Skin: Skin is warm and dry. He is not diaphoretic.    ED Course  Procedures (including critical care time)  Labs Reviewed  DIFFERENTIAL - Abnormal; Notable for the following:    Monocytes Relative 13 (*)    Monocytes Absolute 1.2 (*)    All other components within  normal limits  COMPREHENSIVE METABOLIC PANEL - Abnormal; Notable for the following:    Glucose, Bld 100 (*)    Total Bilirubin 0.2 (*)    GFR calc non Af Amer 61 (*)    GFR calc Af Amer 70 (*)    All other components within normal limits  POCT I-STAT, CHEM 8 - Abnormal; Notable for the following:    Glucose, Bld 102 (*)    All other components within normal limits  HEMOGLOBIN A1C - Abnormal; Notable for the following:    Hemoglobin A1C 6.0 (*)    Mean Plasma Glucose 126 (*)    All other components within normal limits  PROTIME-INR  APTT  CBC  CK TOTAL AND CKMB  TROPONIN I  MRSA PCR SCREENING  LIPID PANEL  LAB REPORT - SCANNED   No results found.   1. Cerebral embolism with cerebral infarction      Date: 02/04/2012  Rate: 82  Rhythm: normal sinus rhythm  QRS Axis: normal  Intervals: normal  ST/T Wave abnormalities: normal  Conduction Disutrbances:none  Narrative Interpretation:   Old EKG Reviewed: unchanged     MDM  The patient arrived as a Code Stroke initiated in the field by EMS.  I saw him immediately upon arrival and he was sent to CT.  The CT was unremarkable.  He was seen by Dr. Thad Ranger.  I believe the plan is to pursue tPa once the blood pressure has been adequately controlled.  Care turned over to Dr. Thad Ranger.          Geoffery Lyons, MD 02/04/12 [phone]

## 2012-03-01 ENCOUNTER — Ambulatory Visit (INDEPENDENT_AMBULATORY_CARE_PROVIDER_SITE_OTHER): Payer: Medicare Other | Admitting: Family Medicine

## 2012-03-01 VITALS — BP 150/89 | HR 67 | Temp 97.9°F | Resp 16 | Ht 66.0 in | Wt 144.6 lb

## 2012-03-01 DIAGNOSIS — E785 Hyperlipidemia, unspecified: Secondary | ICD-10-CM | POA: Insufficient documentation

## 2012-03-01 DIAGNOSIS — I2510 Atherosclerotic heart disease of native coronary artery without angina pectoris: Secondary | ICD-10-CM

## 2012-03-01 DIAGNOSIS — M545 Low back pain, unspecified: Secondary | ICD-10-CM

## 2012-03-01 DIAGNOSIS — I10 Essential (primary) hypertension: Secondary | ICD-10-CM

## 2012-03-01 DIAGNOSIS — F419 Anxiety disorder, unspecified: Secondary | ICD-10-CM

## 2012-03-01 DIAGNOSIS — F17200 Nicotine dependence, unspecified, uncomplicated: Secondary | ICD-10-CM

## 2012-03-01 DIAGNOSIS — N400 Enlarged prostate without lower urinary tract symptoms: Secondary | ICD-10-CM

## 2012-03-01 DIAGNOSIS — R3911 Hesitancy of micturition: Secondary | ICD-10-CM | POA: Insufficient documentation

## 2012-03-01 DIAGNOSIS — M79605 Pain in left leg: Secondary | ICD-10-CM | POA: Insufficient documentation

## 2012-03-01 MED ORDER — SERTRALINE HCL 25 MG PO TABS: ORAL_TABLET | ORAL | Status: DC

## 2012-03-01 MED ORDER — TAMSULOSIN HCL 0.4 MG PO CAPS: 0.4000 mg | ORAL_CAPSULE | Freq: Every day | ORAL | Status: DC

## 2012-03-01 MED ORDER — METOPROLOL SUCCINATE ER 50 MG PO TB24: 50.0000 mg | ORAL_TABLET | Freq: Every day | ORAL | Status: DC

## 2012-03-01 MED ORDER — SIMVASTATIN 20 MG PO TABS: 20.0000 mg | ORAL_TABLET | Freq: Every day | ORAL | Status: DC

## 2012-03-01 MED ORDER — DOXAZOSIN MESYLATE 1 MG PO TABS: 1.0000 mg | ORAL_TABLET | Freq: Every day | ORAL | Status: DC

## 2012-03-01 MED ORDER — CLOPIDOGREL BISULFATE 75 MG PO TABS: 75.0000 mg | ORAL_TABLET | Freq: Every day | ORAL | Status: DC

## 2012-03-01 NOTE — Progress Notes (Signed)
  Subjective:    Patient ID: Jerry [name], male    DOB: 06-11-1933, 78 y.o.   MRN: [id number]  HPI  Patient presents for medication refills  Urinary hesitancy without significant improvement on Flomax  Complains of anxiety and chronic worry  LBP with radiation to (B) LE; requesting motorized wheel chair  PMH/ Legally blind (can still see shapes and colors)           ASCVD; states most recent event 3 weeks ago([facility])            HTN            LBP         SH/ Tobacco 1 ppd X 65 years  Review of Systems     Objective:   Physical Exam  Constitutional: He is oriented to person, place, and time. He appears well-developed.  Eyes: EOM are normal. Pupils are equal, round, and reactive to light.  Neck: Neck supple. No thyromegaly present.  Cardiovascular: Normal rate, regular rhythm and normal heart sounds.   Pulmonary/Chest: Effort normal and breath sounds normal.  Neurological: He is alert and oriented to person, place, and time. He displays no atrophy. A cranial nerve deficit is present. No sensory deficit. He exhibits normal muscle tone.  Reflex Scores:      Bicep reflexes are 1+ on the right side and 1+ on the left side.      Patellar reflexes are 2+ on the right side and 2+ on the left side.         Assessment & Plan:   1. HTN (hypertension)  metoprolol succinate (TOPROL-XL) 50 MG 24 hr tablet  2. ASCVD (arteriosclerotic cardiovascular disease)  Ambulatory referral to Neurology, clopidogrel (PLAVIX) 75 MG tablet  3. BPH (benign prostatic hyperplasia)  Tamsulosin HCl (FLOMAX) 0.4 MG CAPS, doxazosin (CARDURA) 1 MG tablet  4. LBP (low back pain)  Ambulatory referral to [facility]  5. Dyslipidemia  simvastatin (ZOCOR) 20 MG tablet  6. Tobacco dependence    7. Anxiety      Add Cardura 1 mg qhs and slowly titrate for BP control and improvement in urinary symptoms  Unable to order motorized wheelchair as patient walked the circumference of our clinic  unassisted. Follow up after patient has seen specialists

## 2012-04-05 ENCOUNTER — Other Ambulatory Visit: Payer: Self-pay | Admitting: Family Medicine

## 2012-04-06 NOTE — Telephone Encounter (Signed)
Pt is currently out of this medication, and would like to be done as soon as possible.

## 2012-06-24 ENCOUNTER — Other Ambulatory Visit: Payer: Self-pay | Admitting: Family Medicine

## 2012-08-07 ENCOUNTER — Encounter: Payer: Self-pay | Admitting: Vascular Surgery

## 2012-10-11 IMAGING — CT CT ANGIO HEAD
1 of 10 series · 2 of 33 positions shown · IV contrast ([ID] OMNI 350)
Comparison: MR brain 03/22/2011 and 02/09/2011.  MR angiogram
circle of Willis 02/09/2011.

CTA NECK

CLINICAL DATA: Abnormal gait.  Tinnitus.  Recent stroke.  Speech
difficulty.

CT ANGIOGRAPHY HEAD AND NECK
TECHNIQUE: Multidetector CT imaging of the head and neck was
performed using the standard protocol during bolus administration
of intravenous contrast.  Multiplanar CT image reconstructions
including MIPs were obtained to evaluate the vascular anatomy.
Carotid stenosis measurements (when applicable) are obtained
utilizing NASCET criteria, using the distal internal carotid
diameter as the denominator.
Contrast:  100 ml Omnipaque 350.

[Series 6: cta head & neck · axial · 0.39mm/px · z∈[+119,+479]mm · 2 of 145 slices shown]
[im 1/145  soft-tissue]
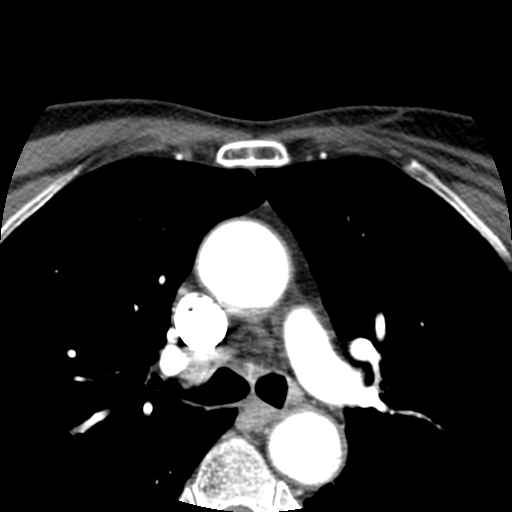
[im 145/145  bone]
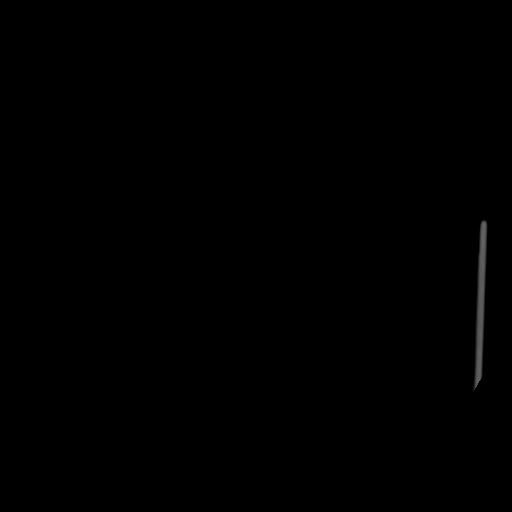

[2 of 33 positions shown; findings below may reference images not displayed]

FINDINGS: Plaque aortic arch most notable just beyond the
subclavian artery.  Focal plaque versus ulceration undersurface of
the aortic arch.

Mild narrowing proximal right innominate artery.  Dilation beyond
this stenosis.  Mild narrowing right common carotid artery.

Plaque at the right carotid bifurcation with 30% diameter stenosis
of the proximal right internal carotid artery.

Moderate narrowing proximal left common carotid artery.

Plaque left carotid bifurcation.  6 mm beyond its origin, there is
an 84% diameter stenosis of the proximal left internal carotid
artery spanning over 7.4 mm.

Mild narrowing with irregular plaque proximal left subclavian
artery.

Moderate narrowing proximal left vertebral artery.

Irregularity of the dominant right vertebral artery without high-
grade stenosis.

Prior fusion C5-6.  Cervical spondylotic changes with spinal
stenosis most prominent C4-5 with cord flattening.

Scarring lung apices.

 Review of the MIP images confirms the above findings.
IMPRESSION: 84% diameter stenosis of the proximal left internal carotid artery.

30% diameter stenosis of the proximal right internal carotid
artery.

Moderate narrowing proximal left vertebral artery.

Plaque aortic arch most notable just beyond the subclavian artery.
Focal plaque versus ulceration undersurface of the aortic arch.

Mild narrowing proximal right innominate artery.

Mild narrowing right common carotid artery.

Moderate narrowing proximal left common carotid artery.

Mild narrowing with irregular plaque proximal left subclavian
artery.

Prior fusion C5-6.  Cervical spondylotic changes with spinal
stenosis most prominent C4-5 with cord flattening.

CTA HEAD
FINDINGS: Subacute / remote right periopercular infarct with
encephalomalacia.  The recent infarcts of the medial right frontal
lobe and medial right temporal lobe are not appreciated present CT.
No CT evidence of large acute infarct.

No intracranial hemorrhage.  No abnormal intracranial enhancing
lesion.  Global atrophy without hydrocephalus.

Partial opacification mastoid air cells greater on the left.  Mild
coaptation of membranes of the soft tissue in the right the
posterior-superior nasopharynx without definitive mass.  Mucosal
abnormality cannot be excluded by MR.  No surrounding neck
adenopathy.

Cavernous segment internal carotid artery with calcified plaque and
mild to moderate narrowing bilaterally. Small bulge along the
lateral aspect of the left internal carotid artery cavernous
segment probably related to atherosclerotic type changes rather
than aneurysm.  Left infundibulum without discrete saccular
aneurysm.

Right vertebral artery dominant.  Very mild narrowing distal left
vertebral artery.  Small PICAs.

Mild to moderate branch vessel irregularity.

 Review of the MIP images confirms the above findings.
IMPRESSION: Mild to moderate narrowing of the cavernous segment of the internal
carotid arteries bilaterally.

Mild narrowing distal left vertebral artery.

Subacute / remote right periopercular infarct with
encephalomalacia.  The recent infarcts of the medial right frontal
lobe and medial right temporal lobe are not appreciated present CT.

Global atrophy without hydrocephalus.

Partial opacification mastoid air cells greater on the left. Please
see above.

## 2014-06-03 ENCOUNTER — Telehealth: Payer: Self-pay

## 2014-06-03 ENCOUNTER — Ambulatory Visit (INDEPENDENT_AMBULATORY_CARE_PROVIDER_SITE_OTHER): Payer: Medicare Other | Admitting: Family Medicine

## 2014-06-03 VITALS — BP 165/100 | HR 91 | Temp 98.2°F | Resp 18 | Ht 64.0 in | Wt 143.0 lb

## 2014-06-03 DIAGNOSIS — Z8673 Personal history of transient ischemic attack (TIA), and cerebral infarction without residual deficits: Secondary | ICD-10-CM

## 2014-06-03 DIAGNOSIS — I10 Essential (primary) hypertension: Secondary | ICD-10-CM

## 2014-06-03 DIAGNOSIS — F17200 Nicotine dependence, unspecified, uncomplicated: Secondary | ICD-10-CM

## 2014-06-03 DIAGNOSIS — N400 Enlarged prostate without lower urinary tract symptoms: Secondary | ICD-10-CM

## 2014-06-03 DIAGNOSIS — H9192 Unspecified hearing loss, left ear: Secondary | ICD-10-CM

## 2014-06-03 DIAGNOSIS — H9190 Unspecified hearing loss, unspecified ear: Secondary | ICD-10-CM

## 2014-06-03 DIAGNOSIS — E785 Hyperlipidemia, unspecified: Secondary | ICD-10-CM

## 2014-06-03 DIAGNOSIS — I739 Peripheral vascular disease, unspecified: Secondary | ICD-10-CM

## 2014-06-03 LAB — CBC
HCT: 41.6 % (ref 39.0–52.0)
Hemoglobin: 15 g/dL (ref 13.0–17.0)
MCH: 31.6 pg (ref 26.0–34.0)
MCHC: 36.1 g/dL — ABNORMAL HIGH (ref 30.0–36.0)
MCV: 87.6 fL (ref 78.0–100.0)
Platelets: 374 10*3/uL (ref 150–400)
RBC: 4.75 MIL/uL (ref 4.22–5.81)
RDW: 14.1 % (ref 11.5–15.5)
WBC: 10.3 10*3/uL (ref 4.0–10.5)

## 2014-06-03 LAB — MICROALBUMIN / CREATININE URINE RATIO
Creatinine, Urine: 140.6 mg/dL
Microalb Creat Ratio: 14 mg/g (ref 0.0–30.0)
Microalb, Ur: 1.97 mg/dL — ABNORMAL HIGH (ref 0.00–1.89)

## 2014-06-03 LAB — TSH: TSH: 2.119 u[IU]/mL (ref 0.350–4.500)

## 2014-06-03 MED ORDER — SIMVASTATIN 20 MG PO TABS: 20.0000 mg | ORAL_TABLET | Freq: Every day | ORAL | Status: DC

## 2014-06-03 MED ORDER — CLOPIDOGREL BISULFATE 75 MG PO TABS: 75.0000 mg | ORAL_TABLET | Freq: Every day | ORAL | Status: DC

## 2014-06-03 MED ORDER — TAMSULOSIN HCL 0.4 MG PO CAPS: 0.4000 mg | ORAL_CAPSULE | Freq: Every day | ORAL | Status: DC

## 2014-06-03 MED ORDER — DOXAZOSIN MESYLATE 1 MG PO TABS: 1.0000 mg | ORAL_TABLET | Freq: Every day | ORAL | Status: DC

## 2014-06-03 MED ORDER — METOPROLOL SUCCINATE ER 50 MG PO TB24: 50.0000 mg | ORAL_TABLET | Freq: Every day | ORAL | Status: DC

## 2014-06-03 NOTE — Patient Instructions (Signed)
Hypertension Hypertension is another name for high blood pressure. High blood pressure forces your heart to work harder to pump blood. A blood pressure reading has two numbers, which includes a higher number over a lower number (example: 110/72). HOME CARE   Have your blood pressure rechecked by your doctor.  Only take medicine as told by your doctor. Follow the directions carefully. The medicine does not work as well if you skip doses. Skipping doses also puts you at risk for problems.  Do not smoke.  Monitor your blood pressure at home as told by your doctor. GET HELP IF:  You think you are having a reaction to the medicine you are taking.  You have repeat headaches or feel dizzy.  You have puffiness (swelling) in your ankles.  You have trouble with your vision. GET HELP RIGHT AWAY IF:   You get a very bad headache and are confused.  You feel weak, numb, or faint.  You get chest or belly (abdominal) pain.  You throw up (vomit).  You cannot breathe very well. MAKE SURE YOU:   Understand these instructions.  Will watch your condition.  Will get help right away if you are not doing well or get worse. Document Released: 04/26/2008 Document Revised: 11/13/2013 Document Reviewed: 08/31/2013 [facility] Patient Information 2015 [place], [place]. This information is not intended to replace advice given to you by your health care provider. Make sure you discuss any questions you have with your health care provider. Peripheral Vascular Disease  Peripheral vascular disease (PVD) is caused by cholesterol buildup in the arteries. The arteries become narrow or clogged. This makes it hard for blood to flow. It happens most in the legs, but it can occur in other areas of your body. HOME CARE   Quit smoking, if you smoke.  Exercise as told by your doctor.  Follow a low-fat, low-cholesterol diet as told by your doctor.  Control your diabetes, if you have diabetes.  Care for your feet  to prevent infection.  Only take medicine as told by your doctor. GET HELP RIGHT AWAY IF:   You have pain or lose feeling (numbness) in your arms or legs.  Your arms or legs turn cold or blue.  You have redness, warmth, and puffiness (swelling) in your arms or legs. MAKE SURE YOU:   Understand these instructions.  Will watch your condition.  Will get help right away if you are not doing well or get worse. Document Released: 02/02/2010 Document Revised: 01/31/2012 Document Reviewed: 02/02/2010 [facility] Patient Information 2015 [place], [place]. This information is not intended to replace advice given to you by your health care provider. Make sure you discuss any questions you have with your health care provider.

## 2014-06-03 NOTE — Progress Notes (Signed)
Chief Complaint:  Chief Complaint  Patient presents with  . Hypertension    Stopped taking metoprolol January 2014  . Cerumen Impaction    Bilateral    HPI: Jerry [name] is a 80 y.o. male who is here for medication refills, has been out of meds for several months 4-6 months He is a smoker 1 ppd x 70 years He has HTN, hyperlipdiemia, PAD, BPH and is noncompliant with meds and follow-up He has has a CVA ans had had some mild residual weakness on the left UE He denies CP, is consistently SOB but is not willing to quit smoking.  He has no problems with CP, [name] edema, palpitations He has had a wire in his ear left ear from hearing aids 2 years ago and it had gotten stuck and ruptured his TM and had to be removed He is hard of hearing does not want more hearing aids uses a contraception he bought from TV and works well  Past Medical History  Diagnosis Date  . Hypertension   . Peripheral vascular disease   . Legally blind   . Carotid artery occlusion   . CVA (cerebral infarction)   . Stroke april 2012  . H/O hiatal hernia   . Depression   . Anxiety    Past Surgical History  Procedure Laterality Date  . Carotid endarterectomy  04/30/2011    left  . Back surgery    . Hernia repair    . Prostate surgery     History   Social History  . Marital Status: Married    Spouse Name: N/A    Number of Children: N/A  . Years of Education: N/A   Social History Main Topics  . Smoking status: Current Every Day Smoker -- 1.00 packs/day for 65 years    Types: Cigarettes  . Smokeless tobacco: Never Used  . Alcohol Use: No  . Drug Use: No  . Sexual Activity: None     Comment: did not ask   Other Topics Concern  . None   Social History Narrative  . None   Family History  Problem Relation Age of Onset  . Cancer Mother   . Heart attack Father   . Heart attack Brother    No Known Allergies Prior to Admission medications   Medication Sig Start Date End Date Taking?  Authorizing Provider  aspirin 325 MG tablet Take 325 mg by mouth daily.   Yes Historical Provider, MD  clopidogrel (PLAVIX) 75 MG tablet TAKE 1 TABLET BY MOUTH EVERY DAY 06/24/12   Morrell RiddleSarah L Weber, PA-C  doxazosin (CARDURA) 1 MG tablet TAKE 1 TABLET BY MOUTH AT BEDTIME 04/05/12   Raymon Muttonyan M Dunn, PA-C  metoprolol succinate (TOPROL-XL) 50 MG 24 hr tablet Take 1 tablet (50 mg total) by mouth daily. Take with or immediately following a meal. 03/01/12   Dois DavenportKaren L Richter, MD  sertraline (ZOLOFT) 25 MG tablet One tablet daily for anxiety 03/01/12   Dois DavenportKaren L Richter, MD  simvastatin (ZOCOR) 20 MG tablet TAKE 1 TABLET BY MOUTH AT BEDTIME 06/24/12   Morrell RiddleSarah L Weber, PA-C  Tamsulosin HCl (FLOMAX) 0.4 MG CAPS Take 1 capsule (0.4 mg total) by mouth daily. 03/01/12   Dois DavenportKaren L Richter, MD     ROS: The patient denies fevers, chills, night sweats, unintentional weight loss, chest pain, palpitations, wheezing, nausea, vomiting, abdominal pain, dysuria, hematuria, melena, numbness, weakness, or tingling.  All other systems have been reviewed and were  otherwise negative with the exception of those mentioned in the HPI and as above.    PHYSICAL EXAM: Filed Vitals:   06/03/14 0821  BP: 165/100  Pulse: 91  Temp: 98.2 F (36.8 C)  Resp: 18   Filed Vitals:   06/03/14 0821  Height: 5\' 4"  (1.626 m)  Weight: 143 lb (64.864 kg)   Body mass index is 24.53 kg/(m^2).  General: Alert, no acute distress HEENT:  Normocephalic, atraumatic, oropharynx patent. EOMI, PERRLA. LEft ear +TM is slightly ruptured/scarred. Right TM normal.   Cardiovascular:  Regular rate and rhythm, no rubs murmurs or gallops.  No Carotid bruits, radial pulse intact. No pedal edema.  Respiratory: Clear to auscultation bilaterally.  No wheezes, rales, or rhonchi.  No cyanosis, no use of accessory musculature GI: No organomegaly, abdomen is soft and non-tender, positive bowel sounds.  No masses. Skin: No rashes. Neurologic: Facial musculature  symmetric. Psychiatric: Patient is appropriate throughout our interaction. Lymphatic: No cervical lymphadenopathy Musculoskeletal: Gait intact. Minimal left UE weakness, 4+/5 [name] normal BIalterally   LABS: Results for orders placed during the hospital encounter of 01/30/12  MRSA PCR SCREENING      Result Value Ref Range   MRSA by PCR NEGATIVE  NEGATIVE  PROTIME-INR      Result Value Ref Range   Prothrombin Time 14.3  11.6 - 15.2 seconds   INR 1.09  0.00 - 1.49  APTT      Result Value Ref Range   aPTT 36  24 - 37 seconds  CBC      Result Value Ref Range   WBC 9.7  4.0 - 10.5 K/uL   RBC 4.28  4.22 - 5.81 MIL/uL   Hemoglobin 13.7  13.0 - 17.0 g/dL   HCT 81.1  91.4 - 78.2 %   MCV 92.1  78.0 - 100.0 fL   MCH 32.0  26.0 - 34.0 pg   MCHC 34.8  30.0 - 36.0 g/dL   RDW 95.6  21.3 - 08.6 %   Platelets 292  150 - 400 K/uL  DIFFERENTIAL      Result Value Ref Range   Neutrophils Relative % 47  43 - 77 %   Neutro Abs 4.5  1.7 - 7.7 K/uL   Lymphocytes Relative 37  12 - 46 %   Lymphs Abs 3.6  0.7 - 4.0 K/uL   Monocytes Relative 13 (*) 3 - 12 %   Monocytes Absolute 1.2 (*) 0.1 - 1.0 K/uL   Eosinophils Relative 3  0 - 5 %   Eosinophils Absolute 0.3  0.0 - 0.7 K/uL   Basophils Relative 1  0 - 1 %   Basophils Absolute 0.1  0.0 - 0.1 K/uL  COMPREHENSIVE METABOLIC PANEL      Result Value Ref Range   Sodium 137  135 - 145 mEq/L   Potassium 3.8  3.5 - 5.1 mEq/L   Chloride 102  96 - 112 mEq/L   CO2 24  19 - 32 mEq/L   Glucose, Bld 100 (*) 70 - 99 mg/dL   BUN 9  6 - 23 mg/dL   Creatinine, Ser 5.78  0.50 - 1.35 mg/dL   Calcium 9.1  8.4 - 46.9 mg/dL   Total Protein 7.5  6.0 - 8.3 g/dL   Albumin 4.0  3.5 - 5.2 g/dL   AST 17  0 - 37 U/L   ALT 13  0 - 53 U/L   Alkaline Phosphatase 94  39 - 117 U/L  Total Bilirubin 0.2 (*) 0.3 - 1.2 mg/dL   GFR calc non Af Amer 61 (*) >90 mL/min   GFR calc Af Amer 70 (*) >90 mL/min  CK TOTAL AND CKMB      Result Value Ref Range   Total CK 76  7 - 232 U/L    CK, MB 2.5  0.3 - 4.0 ng/mL   Relative Index RELATIVE INDEX IS INVALID  0.0 - 2.5  TROPONIN I      Result Value Ref Range   Troponin I <0.30  <0.30 ng/mL  HEMOGLOBIN A1C      Result Value Ref Range   Hemoglobin A1C 6.0 (*) <5.7 %   Mean Plasma Glucose 126 (*) <117 mg/dL  LIPID PANEL      Result Value Ref Range   Cholesterol 123  0 - 200 mg/dL   Triglycerides 161  <096 mg/dL   HDL 45  >04 mg/dL   Total CHOL/HDL Ratio 2.7     VLDL 26  0 - 40 mg/dL   LDL Cholesterol 52  0 - 99 mg/dL  POCT I-STAT, CHEM 8      Result Value Ref Range   Sodium 140  135 - 145 mEq/L   Potassium 3.8  3.5 - 5.1 mEq/L   Chloride 103  96 - 112 mEq/L   BUN 9  6 - 23 mg/dL   Creatinine, Ser 5.40  0.50 - 1.35 mg/dL   Glucose, Bld 981 (*) 70 - 99 mg/dL   Calcium, Ion 1.91  4.78 - 1.32 mmol/L   TCO2 26  0 - 100 mmol/L   Hemoglobin 13.9  13.0 - 17.0 g/dL   HCT 29.5  62.1 - 30.8 %     EKG/XRAY:   Primary read interpreted by Dr. Conley Rolls at [facility].   ASSESSMENT/PLAN: Encounter Diagnoses  Name Primary?  . Essential hypertension Yes  . History of CVA (cerebrovascular accident)   . Other and unspecified hyperlipidemia   . PAD (peripheral artery disease)   . Tobacco dependence   . BPH (benign prostatic hyperplasia)   . Hearing loss, left    F/u in a month Refilled all meds BP needs to be monitored, hypotension needs to be monitored on flomax and cardura if useding cardura since flomax does not work as well by itself.  BP parameters given F/u in 1 month or prn if BP not well controlled Go to ENT for further eval if wants to have better hearing aids. No  E/o infection Go to ER prn  Gross sideeffects, risk and benefits, and alternatives of medications d/w patient. Patient is aware that all medications have potential sideeffects and we are unable to predict every sideeffect or drug-drug interaction that may occur.  Jerry Lisbon PHUONG, DO 06/03/2014 9:58 AM

## 2014-06-03 NOTE — Telephone Encounter (Signed)
Message copied by Johnnette LitterARDWELL, Jordynn Perrier M on Mon Jun 03, 2014  8:37 PM ------      Message from: Hamilton CapriLE, THAO P      Created: Mon Jun 03, 2014  7:07 PM      Regarding: Hyperkalemia       Can you call [facility] to get them to either repeat the Potassium level or let me know if the specimen was hemolyzed. His K was 5.5.            I tried calling them myself but they could not pull up his account by his name or DOB , they would the acquisition number.            Call me if you have any questions. Thanks!            Dr Conley RollsLe      [phone]             ------

## 2014-06-03 NOTE — Telephone Encounter (Signed)
[facility]. They will have this done tonight.

## 2014-06-04 LAB — COMPLETE METABOLIC PANEL WITH GFR
Albumin: 4.6 g/dL (ref 3.5–5.2)
Alkaline Phosphatase: 74 U/L (ref 39–117)
BUN: 18 mg/dL (ref 6–23)
CO2: 26 mEq/L (ref 19–32)
Calcium: 9.4 mg/dL (ref 8.4–10.5)
GFR, Est African American: 72 mL/min
GFR, Est Non African American: 63 mL/min
Glucose, Bld: 86 mg/dL (ref 70–99)
Potassium: 5.5 mEq/L — ABNORMAL HIGH (ref 3.5–5.3)
Sodium: 135 mEq/L (ref 135–145)
Total Protein: 7.8 g/dL (ref 6.0–8.3)

## 2014-06-04 LAB — LIPID PANEL
Cholesterol: 193 mg/dL (ref 0–200)
HDL: 51 mg/dL (ref >39)
LDL Cholesterol: 98 mg/dL (ref 0–99)
Total CHOL/HDL Ratio: 3.8 Ratio
Triglycerides: 222 mg/dL — ABNORMAL HIGH (ref <150)
VLDL: 44 mg/dL — ABNORMAL HIGH (ref 0–40)

## 2014-06-04 LAB — COMPLETE METABOLIC PANEL WITHOUT GFR
ALT: 24 U/L (ref 0–53)
AST: 20 U/L (ref 0–37)
Chloride: 99 meq/L (ref 96–112)
Creat: 1.12 mg/dL (ref 0.50–1.35)
Total Bilirubin: 0.3 mg/dL (ref 0.2–1.2)

## 2014-06-05 ENCOUNTER — Telehealth: Payer: Self-pay | Admitting: Family Medicine

## 2014-06-05 ENCOUNTER — Other Ambulatory Visit (INDEPENDENT_AMBULATORY_CARE_PROVIDER_SITE_OTHER): Payer: Medicare Other | Admitting: *Deleted

## 2014-06-05 DIAGNOSIS — E875 Hyperkalemia: Secondary | ICD-10-CM

## 2014-06-05 LAB — BASIC METABOLIC PANEL WITH GFR
BUN: 16 mg/dL (ref 6–23)
Calcium: 8.8 mg/dL (ref 8.4–10.5)
Glucose, Bld: 96 mg/dL (ref 70–99)
Potassium: 4.7 meq/L (ref 3.5–5.3)

## 2014-06-05 LAB — BASIC METABOLIC PANEL
CO2: 27 mEq/L (ref 19–32)
Chloride: 102 mEq/L (ref 96–112)
Creat: 1.13 mg/dL (ref 0.50–1.35)
Sodium: 137 mEq/L (ref 135–145)

## 2014-06-05 NOTE — Telephone Encounter (Signed)
Spoke to patient about labs. Told him to come in to get K repeated since it was not hemolyzed and K was still high at 5.5 .

## 2014-07-15 ENCOUNTER — Ambulatory Visit (INDEPENDENT_AMBULATORY_CARE_PROVIDER_SITE_OTHER): Payer: Medicare Other | Admitting: Emergency Medicine

## 2014-07-15 VITALS — BP 116/74 | HR 55 | Temp 97.6°F | Resp 14 | Ht 65.25 in | Wt 141.6 lb

## 2014-07-15 DIAGNOSIS — Z23 Encounter for immunization: Secondary | ICD-10-CM

## 2014-07-15 DIAGNOSIS — N400 Enlarged prostate without lower urinary tract symptoms: Secondary | ICD-10-CM

## 2014-07-15 DIAGNOSIS — Z125 Encounter for screening for malignant neoplasm of prostate: Secondary | ICD-10-CM

## 2014-07-15 LAB — IFOBT (OCCULT BLOOD): IMMUNOLOGICAL FECAL OCCULT BLOOD TEST: POSITIVE

## 2014-07-15 NOTE — Progress Notes (Signed)
   Subjective:    Patient ID: Jerry [name], male    DOB: 06-21-1933, [address] [id number]  This chart was scribed for Lesle Chris, MD by Gwenevere Abbot, ED scribe. This patient was seen in room Room/bed 11 and the patient's care was started at 8:43 AM.    Chief Complaint  Patient presents with  . Follow-up    Was     HPI  HPI Comments:  Jerry [name] is a 80 y.o. male with a h/o stroke who presents to [facility] for a folllow- up visit. Pt reports that his Neurologist is Dr. Anne Hahn. Pt reports that he did take his blood pressure medication on this morning. Pt reports that he was prescribed medication to help him urinate, however, he does not feel like the medication has really helped.    Review of Systems  Genitourinary: Positive for difficulty urinating.       Objective:   Physical Exam  Nursing note and vitals reviewed. Constitutional: He is oriented to person, place, and time. He appears well-developed and well-nourished.  HENT:  Head: Normocephalic and atraumatic.  Eyes: EOM are normal.  Neck: Normal range of motion. Neck supple.  Cardiovascular: Normal rate and regular rhythm.   Pulmonary/Chest: Effort normal.  Decreased breath sounds consistent with COPD  Abdominal: Soft.  Genitourinary:  Prostate symmetrically enlarged without nodules.  Musculoskeletal: Normal range of motion.  Neurological: He is alert and oriented to person, place, and time.  Skin: Skin is warm and dry.  Left carotid scar  Psychiatric: He has a normal mood and affect. His behavior is normal.          Assessment & Plan:    Results for orders placed in visit on 07/15/14  IFOBT (OCCULT BLOOD)      Result Value Ref Range   IFOBT Positive     blood pressure is excellent. No change in the blood pressure medications. He continues to have some urinary symptoms. Prostate is symmetrically enlarged. Will await PSA results. He was given 3 occult blood test to do. He refuses to quit smoking. He  refuses to have a colonoscopy. He did lead to give him a Prevnar shot. He did let to give him a referral to urology I personally performed the services described in this documentation, which was scribed in my presence. The recorded information has been reviewed and is accurate.

## 2014-07-16 LAB — PSA, MEDICARE: PSA: 2.13 ng/mL (ref <=4.00)

## 2014-10-09 ENCOUNTER — Ambulatory Visit (INDEPENDENT_AMBULATORY_CARE_PROVIDER_SITE_OTHER): Payer: Medicare Other | Admitting: Emergency Medicine

## 2014-10-09 VITALS — BP 140/80 | HR 64 | Temp 97.2°F | Resp 16 | Ht 64.5 in | Wt 131.0 lb

## 2014-10-09 DIAGNOSIS — Z8673 Personal history of transient ischemic attack (TIA), and cerebral infarction without residual deficits: Secondary | ICD-10-CM

## 2014-10-09 DIAGNOSIS — I739 Peripheral vascular disease, unspecified: Secondary | ICD-10-CM

## 2014-10-09 DIAGNOSIS — F17200 Nicotine dependence, unspecified, uncomplicated: Secondary | ICD-10-CM

## 2014-10-09 NOTE — Progress Notes (Signed)
[facility] [address], [place] [id number] [phone]  Date:  10/09/2014   Name:  Jerry DownerJames A [name]   DOB:  [birth year]   MRN:  [id number]  PCP:  Pearson GrippeKIM, Syre, MD    Chief Complaint: Medication Refill and Other   History of Present Illness:  Jerry DownerJames A Boardman is a 80 y.o. very pleasant male patient who presents with the following:  Requests a review of his medication.  Can't walk from parking lots due shortness of breath and claudication Needs parking sticker.   Patient Active Problem List   Diagnosis Date Noted  . Urinary hesitancy 03/01/2012  . BPH (benign prostatic hyperplasia) 03/01/2012  . LBP (low back pain) 03/01/2012  . Dyslipidemia 03/01/2012  . Anxiety 03/01/2012  . Tobacco dependence 03/01/2012  . Cerebral embolism with cerebral infarction 01/30/2012  . Hemiplegia, unspecified, affecting nondominant side 01/30/2012  . Hypertension 01/30/2012    Past Medical History  Diagnosis Date  . Hypertension   . Peripheral vascular disease   . Legally blind   . Carotid artery occlusion   . CVA (cerebral infarction)   . Stroke april 2012  . H/O hiatal hernia   . Depression   . Anxiety     Past Surgical History  Procedure Laterality Date  . Carotid endarterectomy  04/30/2011    left  . Back surgery    . Hernia repair    . Prostate surgery      History  Substance Use Topics  . Smoking status: Current Every Day Smoker -- 1.00 packs/day for 65 years    Types: Cigarettes  . Smokeless tobacco: Never Used  . Alcohol Use: No    Family History  Problem Relation Age of Onset  . Cancer Mother   . Heart attack Father   . Heart attack Brother     No Known Allergies  Medication list has been reviewed and updated.  Current Outpatient Prescriptions on File Prior to Visit  Medication Sig Dispense Refill  . clopidogrel (PLAVIX) 75 MG tablet Take 1 tablet (75 mg total) by mouth daily. 90 tablet 3  . doxazosin (CARDURA) 1 MG tablet Take 1  tablet (1 mg total) by mouth at bedtime. 30 tablet 5  . metoprolol succinate (TOPROL-XL) 50 MG 24 hr tablet Take 1 tablet (50 mg total) by mouth daily. Take with food. For you high blood pressure, blood pressure should be less than 140/90 90 tablet 3  . simvastatin (ZOCOR) 20 MG tablet Take 1 tablet (20 mg total) by mouth at bedtime. For your high cholesterol , to prevent strokes and heartattack 90 tablet 3  . tamsulosin (FLOMAX) 0.4 MG CAPS capsule Take 1 capsule (0.4 mg total) by mouth daily. For your big prostate 90 capsule 3  . [DISCONTINUED] lisinopril (PRINIVIL,ZESTRIL) 20 MG tablet Take 1 tablet (20 mg total) by mouth daily. 30 tablet 0   No current facility-administered medications on file prior to visit.    Review of Systems:  As per HPI, otherwise negative.    Physical Examination: Filed Vitals:   10/09/14 0924  BP: 140/80  Pulse: 64  Temp: 97.2 F (36.2 C)  Resp: 16   Filed Vitals:   10/09/14 0924  Height: 5' 4.5" (1.638 m)  Weight: 131 lb (59.421 kg)   Body mass index is 22.15 kg/(m^2). Ideal Body Weight: Weight in (lb) to have BMI = 25: 147.6   GEN: WDWN, NAD, Non-toxic, Alert & Oriented x 3 HEENT: Atraumatic, Normocephalic.  Ears and Nose: No external deformity. EXTR: No clubbing/cyanosis/edema NEURO: Normal gait.  PSYCH: Normally interactive. Conversant. Not depressed or anxious appearing.  Calm demeanor.    Assessment and Plan: Handicap sticker  Medication verification  Signed,  Phillips OdorJeffery Quinnton Bury, MD

## 2015-03-17 ENCOUNTER — Encounter: Payer: Self-pay | Admitting: *Deleted

## 2015-04-11 ENCOUNTER — Telehealth: Payer: Self-pay | Admitting: *Deleted

## 2015-04-11 NOTE — Telephone Encounter (Signed)
Patient's daughter phoned & left message in response to letter, clarifying patient's PCP as Hamilton Caprihao Le, OD, not Pearson GrippeJames Kim.  PCP updated in care team section.  Unfortunately, she did not mention patient's AWV.  Will follow up.

## 2015-05-09 ENCOUNTER — Encounter: Payer: Self-pay | Admitting: *Deleted

## 2015-06-13 ENCOUNTER — Ambulatory Visit (INDEPENDENT_AMBULATORY_CARE_PROVIDER_SITE_OTHER): Payer: Medicare Other | Admitting: Family Medicine

## 2015-06-13 VITALS — BP 152/84 | HR 74 | Temp 97.8°F | Resp 16 | Ht 66.0 in | Wt 145.0 lb

## 2015-06-13 DIAGNOSIS — N400 Enlarged prostate without lower urinary tract symptoms: Secondary | ICD-10-CM | POA: Diagnosis not present

## 2015-06-13 DIAGNOSIS — G479 Sleep disorder, unspecified: Secondary | ICD-10-CM | POA: Diagnosis not present

## 2015-06-13 DIAGNOSIS — F17210 Nicotine dependence, cigarettes, uncomplicated: Secondary | ICD-10-CM

## 2015-06-13 DIAGNOSIS — R351 Nocturia: Secondary | ICD-10-CM | POA: Diagnosis not present

## 2015-06-13 DIAGNOSIS — H9312 Tinnitus, left ear: Secondary | ICD-10-CM | POA: Diagnosis not present

## 2015-06-13 DIAGNOSIS — I739 Peripheral vascular disease, unspecified: Secondary | ICD-10-CM

## 2015-06-13 DIAGNOSIS — I10 Essential (primary) hypertension: Secondary | ICD-10-CM | POA: Diagnosis not present

## 2015-06-13 DIAGNOSIS — R42 Dizziness and giddiness: Secondary | ICD-10-CM | POA: Diagnosis not present

## 2015-06-13 DIAGNOSIS — H9319 Tinnitus, unspecified ear: Secondary | ICD-10-CM | POA: Insufficient documentation

## 2015-06-13 DIAGNOSIS — F17200 Nicotine dependence, unspecified, uncomplicated: Secondary | ICD-10-CM

## 2015-06-13 LAB — COMPREHENSIVE METABOLIC PANEL
ALBUMIN: 4.5 g/dL (ref 3.5–5.2)
ALK PHOS: 84 U/L (ref 39–117)
ALT: 14 U/L (ref 0–53)
AST: 17 U/L (ref 0–37)
BILIRUBIN TOTAL: 0.3 mg/dL (ref 0.2–1.2)
BUN: 10 mg/dL (ref 6–23)
CO2: 27 mEq/L (ref 19–32)
Calcium: 9.6 mg/dL (ref 8.4–10.5)
Chloride: 97 mEq/L (ref 96–112)
Creat: 1.26 mg/dL (ref 0.50–1.35)
Glucose, Bld: 80 mg/dL (ref 70–99)
POTASSIUM: 5.1 meq/L (ref 3.5–5.3)
SODIUM: 135 meq/L (ref 135–145)
Total Protein: 7.9 g/dL (ref 6.0–8.3)

## 2015-06-13 LAB — POCT CBC
GRANULOCYTE PERCENT: 53.2 % (ref 37–80)
HEMATOCRIT: 42.1 % — AB (ref 43.5–53.7)
HEMOGLOBIN: 14.5 g/dL (ref 14.1–18.1)
LYMPH, POC: 3.8 — AB (ref 0.6–3.4)
MCH, POC: 30.9 pg (ref 27–31.2)
MCHC: 34.5 g/dL (ref 31.8–35.4)
MCV: 89.6 fL (ref 80–97)
MID (cbc): 1 — AB (ref 0–0.9)
MPV: 7 fL (ref 0–99.8)
PLATELET COUNT, POC: 344 10*3/uL (ref 142–424)
POC GRANULOCYTE: 5.5 (ref 2–6.9)
POC LYMPH %: 36.7 % (ref 10–50)
POC MID %: 10.1 % (ref 0–12)
RBC: 4.7 M/uL (ref 4.69–6.13)
RDW, POC: 14.1 %
WBC: 10.3 10*3/uL — AB (ref 4.6–10.2)

## 2015-06-13 LAB — POCT UA - MICROSCOPIC ONLY
CRYSTALS, UR, HPF, POC: NEGATIVE
Casts, Ur, LPF, POC: NEGATIVE
Mucus, UA: NEGATIVE
RBC, urine, microscopic: NEGATIVE
Yeast, UA: NEGATIVE

## 2015-06-13 LAB — LIPID PANEL
Cholesterol: 154 mg/dL (ref 0–200)
HDL: 51 mg/dL (ref >=40)
LDL Cholesterol: 54 mg/dL (ref 0–99)
Total CHOL/HDL Ratio: 3 Ratio
Triglycerides: 246 mg/dL — ABNORMAL HIGH (ref <150)
VLDL: 49 mg/dL — AB (ref 0–40)

## 2015-06-13 LAB — GLUCOSE, POCT (MANUAL RESULT ENTRY): POC Glucose: 71 mg/dl (ref 70–99)

## 2015-06-13 LAB — POCT URINALYSIS DIPSTICK
Bilirubin, UA: NEGATIVE
Blood, UA: NEGATIVE
GLUCOSE UA: NEGATIVE
Ketones, UA: NEGATIVE
Nitrite, UA: NEGATIVE
Protein, UA: NEGATIVE
SPEC GRAV UA: 1.01
Urobilinogen, UA: 0.2
pH, UA: 6

## 2015-06-13 LAB — TSH: TSH: 2.783 u[IU]/mL (ref 0.350–4.500)

## 2015-06-13 MED ORDER — OXYBUTYNIN CHLORIDE 5 MG PO TABS: 5.0000 mg | ORAL_TABLET | Freq: Two times a day (BID) | ORAL | Status: AC

## 2015-06-13 NOTE — Progress Notes (Signed)
Patient ID: Jerry [name], male    DOB: July 22, 1933, 81 y.o.   MRN: [id number]  PCP: Rockne Coons, DO  Subjective:   Chief Complaint  Patient presents with  . med review    needs to know which meds he can discontinue  . Dizziness    x1 month    HPI Presents for evaluation of current medication regimen and dizziness. He is accompanied by his daughter, Lawanna Kobus.  PMH of CVA, BPH, and tobacco use.   Pt reports a desire to cut back on taking his medications, as he feels his appetite and sleep are better when he is not taking his meds. He reports trying to "wean himself off" and currently takes metoprolol, ASA, simvastatin, and clopidogrel 2x/week.   He reports discontinuing his doxazosin 6-12 months ago, as it did nothing for the symptoms associated with his BPH. The patient states that he is urinating regularly and emptying his bladder completely with a slow, interrupted stream, it just takes him longer to urinate. He states that he is urinating during the night, waking up every 1-2 hours to urinate, or because he has chronic tinnitus of his left ear. He also urinates every couple of hours during the day.  He expresses interest in a medication that he can take before bed to help him sleep. He's sleeping for a couple of hours at a time, around the clock. Denies feeling tired.   The pt reports struggling with dizziness for "30 years". He says he has been told by a doctor that he has inner ear trouble. His daughter states that she noticed his dizziness worsening after his stroke (April/May 2012). Pt states dizziness occurs when he stands from sitting, requiring him to steady himself or sit back down. Moving his head quickly or getting up from sitting too quickly causes it, and the dizziness lasts a couple minutes. Describes the dizziness as a light-headedness, denies room-spinning and N/V. Patient says that he has tried his daughters medication for vertigo and it did not help.   Blood  pressure yesterday reportedly 120/s/80's.   Review of Systems Constitutional: Negative.  BP 152/84 mmHg  Pulse 74  Temp(Src) 97.8 F (36.6 C) (Oral)  Resp 16  Ht 5\' 6"  (1.676 m)  Wt 145 lb (65.772 kg)  BMI 23.41 kg/m2  SpO2 95%  HENT: Positive for congestion and tinnitus. Negative for ear pain and trouble swallowing.  Eyes: Negative for pain. Visual disturbance: "legally blind" due to ruptured blood vessels behind the eye.  Respiratory: Positive for cough (associated with smoking). Negative for chest tightness and shortness of breath.  Cardiovascular: Negative for chest pain and palpitations.  Gastrointestinal: Negative for nausea, vomiting, abdominal pain, diarrhea, constipation (uses a stool softener when constipated, less than once monthly), blood in stool and rectal pain.  Endocrine: Negative.  Genitourinary: Positive for decreased urine volume and difficulty urinating (hesitancy; interrupted stream with small void volume). Negative for dysuria.  Musculoskeletal: Positive for back pain (when walking for a long time, gets sciatic pain BL to his feet ; does not want any more surgeries, as he has had 4 spinal surgeries.).  Skin: Negative.  Neurological: Positive for light-headedness and headaches (slight HA all the time, attributes to tinnitus).  Hematological: Negative.      Patient Active Problem List   Diagnosis Date Noted  . Urinary hesitancy 03/01/2012  . BPH (benign prostatic hyperplasia) 03/01/2012  . LBP (low back pain) 03/01/2012  . Dyslipidemia 03/01/2012  . Anxiety 03/01/2012  .  Tobacco dependence 03/01/2012  . Cerebral embolism with cerebral infarction 01/30/2012  . Hemiplegia, unspecified, affecting nondominant side 01/30/2012  . Hypertension 01/30/2012     Prior to Admission medications   Medication Sig Start Date End Date Taking? Authorizing Provider  aspirin 325 MG tablet Take 325 mg by mouth daily.   Yes Historical Provider, MD  clopidogrel (PLAVIX) 75  MG tablet Take 1 tablet (75 mg total) by mouth daily. 06/03/14  Yes Thao P Le, DO  metoprolol succinate (TOPROL-XL) 50 MG 24 hr tablet Take 1 tablet (50 mg total) by mouth daily. Take with food. For you high blood pressure, blood pressure should be less than 140/90 06/03/14  Yes Thao P Le, DO  simvastatin (ZOCOR) 20 MG tablet Take 1 tablet (20 mg total) by mouth at bedtime. For your high cholesterol , to prevent strokes and heartattack 06/03/14  Yes Thao P Le, DO  doxazosin (CARDURA) 1 MG tablet Take 1 tablet (1 mg total) by mouth at bedtime. Patient not taking: Reported on 06/13/2015 06/03/14  NO Thao P Le, DO  tamsulosin (FLOMAX) 0.4 MG CAPS capsule Take 1 capsule (0.4 mg total) by mouth daily. For your big prostate Patient not taking: Reported on 06/13/2015 06/03/14  NO Thao P Le, DO     No Known Allergies     Objective:  Physical Exam  Constitutional: He is oriented to person, place, and time. He appears well-developed and well-nourished. He is active and cooperative. No distress.  BP 152/84 mmHg  Pulse 74  Temp(Src) 97.8 F (36.6 C) (Oral)  Resp 16  Ht  (1.676 m)  Wt 145 lb (65.772 kg)  BMI 23.41 kg/m2  SpO2 95%   Eyes: Conjunctivae are normal.  Neck: No thyromegaly present.  Cardiovascular: Normal rate, regular rhythm and normal heart sounds.   Pulmonary/Chest: Effort normal and breath sounds normal.  Lymphadenopathy:    He has no cervical adenopathy.  Neurological: He is alert and oriented to person, place, and time.  Skin: Skin is warm and dry.  Psychiatric: He has a normal mood and affect. His speech is normal and behavior is normal.   EKG reviewed with Dr. Neva Seat. No acute ischemia. Unchanged from previous tracings.  Results for orders placed or performed in visit on 06/13/15  POCT CBC  Result Value Ref Range   WBC 10.3 (A) 4.6 - 10.2 K/uL   Lymph, poc 3.8 (A) 0.6 - 3.4   POC LYMPH PERCENT 36.7 10 - 50 %L   MID (cbc) 1.0 (A) 0 - 0.9   POC MID % 10.1 0 - 12 %M    POC Granulocyte 5.5 2 - 6.9   Granulocyte percent 53.2 37 - 80 %G   RBC 4.70 4.69 - 6.13 M/uL   Hemoglobin 14.5 14.1 - 18.1 g/dL   HCT, POC 65.7 (A) 84.6 - 53.7 %   MCV 89.6 80 - 97 fL   MCH, POC 30.9 27 - 31.2 pg   MCHC 34.5 31.8 - 35.4 g/dL   RDW, POC 96.2 %   Platelet Count, POC 344 142 - 424 K/uL   MPV 7.0 0 - 99.8 fL  POCT glucose (manual entry)  Result Value Ref Range   POC Glucose 71 70 - 99 mg/dl  POCT UA - Microscopic Only  Result Value Ref Range   WBC, Ur, HPF, POC 0-2    RBC, urine, microscopic neg    Bacteria, U Microscopic trace    Mucus, UA neg    Epithelial  cells, urine per micros 0-1    Crystals, Ur, HPF, POC neg    Casts, Ur, LPF, POC neg    Yeast, UA neg   POCT urinalysis dipstick  Result Value Ref Range   Color, UA yellow    Clarity, UA clear    Glucose, UA neg    Bilirubin, UA neg    Ketones, UA neg    Spec Grav, UA 1.010    Blood, UA neg    pH, UA 6.0    Protein, UA neg    Urobilinogen, UA 0.2    Nitrite, UA neg    Leukocytes, UA Trace (A) Negative         Assessment & Plan:   1. Dizziness THis appears to be BPV. It's long-standing. No obvious cause. No orthostasis. No evidence of infection. No acute neurologic symptoms or findings. Suspect that improved sleep will improve his symptoms. - POCT glucose (manual entry) - EKG 12-Lead  2. Essential hypertension Not controlled here today, but possibly controlled out of the office. He reports being nervous being here. He'll check his BP weekly in the community and record the results. If he remains above 140/90 consistently, he'll need to resume the metoprolol daily. - POCT CBC - Comprehensive metabolic panel - TSH  3. Sleep disturbance Unclear if this is just the longstanding tinnitus or if nocturia is contributing (my suspicion). Elect against sedating medication at this time to reduce fall risk.  4. PAD (peripheral artery disease), s/p CVA Understanding the use of ASA and Plavix to reduce  risk of recurrent stroke, he agrees to resume them daily. We'll await the lipid profile before determining the need for the statin. - Lipid panel  5. BPH (benign prostatic hyperplasia) No benefit with Flomax or Cardura. Trial of oxybutynin to help reduce urinary frequency.  6. Smoker unmotivated to quit Counseled the importance of smoking cessation to reduce his cardiovascular risk. He does not plan to stop smoking.  7. Nocturia See above. - POCT UA - Microscopic Only - POCT urinalysis dipstick - oxybutynin (DITROPAN) 5 MG tablet; Take 1 tablet (5 mg total) by mouth 2 (two) times daily.  Dispense: 60 tablet; Refill: 1  8. Tinnitus, left Consider audiology evaluation. He may benefit from a hearing aid.   Fernande Bras, PA-C Physician Assistant-Certified [facility]

## 2015-06-13 NOTE — Progress Notes (Signed)
Subjective:    Patient ID: Jerry [name], male    DOB: Mar 21, 1933, 81 y.o.   MRN: [id number]  HPI  Pt with PMH of of CVA, BPH, and tobacco use presents today for evaluation of dizziness and to go over his medications. His daughter is in the room. Pt reports a desire to cut back on taking his medications, as he feels his appetite and sleep are better when he is not taking his meds. He reports tryingto "wean himself off" and currently takes metoprolol, ASA, simvastatin, and clopidogrel 2x/week. He reports discontinuing his doxazosin 6-12 months ago, as it did nothing for the symptoms associated with his BPH. The patient states that he is urinating regularly and emptying his bladder completely with a slow, interrupted stream, it just takes him longer to urinate.  He states that he is urinating during the night, waking up every 1-2 hours to urinate, or because he has chronic tinnitus of his left ear. He expresses interest in a medication that he can take before bed to help him sleep.  The pt reports struggling with dizziness for "30 years". He says he has been told by a doctor that he has inner ear trouble. His daughter states that she noticed his dizziness worsening after his stroke (April/May 2012). Pt states dizziness occurs when he stands from sitting, requiring him to steady himself or sit back down. Moving his head quickly or getting up from sitting too quickly causes it, and the dizziness lasts a couple minutes. Describes the dizziness as a light-headedness, denies room-spinning and N/V. Patient says that he has tried his daughters medication for vertigo and it did not help.   Review of Systems  Constitutional: Negative.        BP 152/84 mmHg  Pulse 74  Temp(Src) 97.8 F (36.6 C) (Oral)  Resp 16  Ht  (1.676 m)  Wt 145 lb (65.772 kg)  BMI 23.41 kg/m2  SpO2 95%  HENT: Positive for congestion and tinnitus. Negative for ear pain and trouble swallowing.   Eyes: Negative for pain. 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  Respiratory: Positive for cough (associated with smoking). Negative for chest tightness and shortness of breath.   Cardiovascular: Negative for chest pain and palpitations.  Gastrointestinal: Negative for nausea, vomiting, abdominal pain, diarrhea, constipation (uses a stool softener when constipated, less than once monthly), blood in stool and rectal pain.  Endocrine: Negative.   Genitourinary: Positive for decreased urine volume and difficulty urinating (hesitancy; interrupted stream with small void volume). Negative for dysuria.  Musculoskeletal: Positive for back pain (when walking for a long time, gets sciatic pain BL to his feet ; does not want any more surgeries, as he has had 4 spinal surgeries.).  Skin: Negative.   Neurological: Positive for light-headedness and headaches (slight HA all the time, attributes to tinnitus).  Hematological: Negative.        Objective:   Physical Exam  Constitutional: He is oriented to person, place, and time. He appears well-developed and well-nourished. No distress.  HENT:  Head: Normocephalic and atraumatic.  Eyes: Pupils are equal, round, and reactive to light. Right eye exhibits no discharge. Left eye exhibits no discharge. No scleral icterus.  Neck: Normal range of motion. Neck supple. No tracheal deviation present.  Cardiovascular: Normal rate, regular rhythm, normal heart sounds and intact distal pulses.  Exam reveals no gallop and no friction rub.   No murmur heard. Pulmonary/Chest: Effort normal. No  respiratory distress. He has no wheezes. He has no rales.  Musculoskeletal: Normal range of motion. He exhibits no edema.  Lymphadenopathy:    He has no cervical adenopathy.  Neurological: He is alert and oriented to person, place, and time.  Skin: Skin is warm and dry. No rash noted. He is not diaphoretic. No erythema. No pallor.  Psychiatric: He has a normal mood and  affect. His behavior is normal. Judgment and thought content normal.   Results for orders placed or performed in visit on 06/13/15  POCT CBC  Result Value Ref Range   WBC 10.3 (A) 4.6 - 10.2 K/uL   Lymph, poc 3.8 (A) 0.6 - 3.4   POC LYMPH PERCENT 36.7 10 - 50 %L   MID (cbc) 1.0 (A) 0 - 0.9   POC MID % 10.1 0 - 12 %M   POC Granulocyte 5.5 2 - 6.9   Granulocyte percent 53.2 37 - 80 %G   RBC 4.70 4.69 - 6.13 M/uL   Hemoglobin 14.5 14.1 - 18.1 g/dL   HCT, POC 19.1 (A) 47.8 - 53.7 %   MCV 89.6 80 - 97 fL   MCH, POC 30.9 27 - 31.2 pg   MCHC 34.5 31.8 - 35.4 g/dL   RDW, POC 29.5 %   Platelet Count, POC 344 142 - 424 K/uL   MPV 7.0 0 - 99.8 fL  POCT glucose (manual entry)  Result Value Ref Range   POC Glucose 71 70 - 99 mg/dl  POCT UA - Microscopic Only  Result Value Ref Range   WBC, Ur, HPF, POC 0-2    RBC, urine, microscopic neg    Bacteria, U Microscopic trace    Mucus, UA neg    Epithelial cells, urine per micros 0-1    Crystals, Ur, HPF, POC neg    Casts, Ur, LPF, POC neg    Yeast, UA neg   POCT urinalysis dipstick  Result Value Ref Range   Color, UA yellow    Clarity, UA clear    Glucose, UA neg    Bilirubin, UA neg    Ketones, UA neg    Spec Grav, UA 1.010    Blood, UA neg    pH, UA 6.0    Protein, UA neg    Urobilinogen, UA 0.2    Nitrite, UA neg    Leukocytes, UA Trace (A) Negative         Assessment & Plan:  1. Dizziness - POCT glucose (manual entry) - EKG 12-Lead - CBC - no signs of arrhythmia, anemia, or DM  2. Essential hypertension - POCT CBC - Comprehensive metabolic panel - TSH - monitor BP at home, if consistently >140/90 restart metoprolol   3. PAD (peripheral artery disease) - Lipid panel pending  4. Nocturia 5. Sleep disturbance 6. BPH (benign prostatic hyperplasia) - POCT UA - Microscopic Only - POCT urinalysis dipstick - oxybutynin (DITROPAN) 5 MG tablet; Take 1 tablet (5 mg total) by mouth 2 (two) times daily.  Dispense: 60  tablet; Refill: 1  7. Tinnitus, left  - chronic 8. Smoker unmotivated to quit

## 2015-06-13 NOTE — Progress Notes (Signed)
EKG read and patient discussed with Jerry [name]. Agree with assessment and plan of care per her note.   

## 2015-06-13 NOTE — Patient Instructions (Addendum)
Your blood pressures that were measured in the office today were too high. Please monitor your blood pressures using an at-home blood pressure machine, or one in the pharmacy, for the next couple of weeks. If your blood pressure stays below 140/90, you do not have to take the metoprolol. However, if your blood pressure is 140/90 or higher, it is important to re-start the use of metoprolol daily to help decrease your risk for another stroke.   We are waiting on your lipid panel (cholesterol level) to come back from the lab. We will contact you about your levels once the results come in. If your lipid levels are high, re-starting your simvastatin will be necessary to decrease your risk for another stroke.   Continue to take your aspirin and plavix (clopidogrel) every day, as prescribed. For now, you can discontinue the metoprolol and simvastatin, hoewever if your blood pressure is elevated or your cholesterol is high, we will need to restart these medications.   We have prescribed you oxybutynin to help decrease your urinary frequency. This dose can be increased if you feel like you are still urinating too frequently.

## 2015-06-18 ENCOUNTER — Other Ambulatory Visit: Payer: Self-pay | Admitting: Family Medicine

## 2015-06-27 ENCOUNTER — Other Ambulatory Visit: Payer: Self-pay | Admitting: Family Medicine

## 2015-12-26 ENCOUNTER — Ambulatory Visit: Payer: Medicare Other | Admitting: Podiatry

## 2017-08-03 ENCOUNTER — Inpatient Hospital Stay (HOSPITAL_COMMUNITY)
Admission: EM | Admit: 2017-08-03 | Discharge: 2017-08-22 | DRG: 064 | Disposition: E | Payer: Medicare Other | Attending: Internal Medicine | Admitting: Internal Medicine

## 2017-08-03 ENCOUNTER — Encounter (HOSPITAL_COMMUNITY): Payer: Self-pay

## 2017-08-03 ENCOUNTER — Emergency Department (HOSPITAL_COMMUNITY): Payer: Medicare Other

## 2017-08-03 DIAGNOSIS — G8191 Hemiplegia, unspecified affecting right dominant side: Secondary | ICD-10-CM | POA: Diagnosis present

## 2017-08-03 DIAGNOSIS — Z66 Do not resuscitate: Secondary | ICD-10-CM | POA: Diagnosis present

## 2017-08-03 DIAGNOSIS — I639 Cerebral infarction, unspecified: Secondary | ICD-10-CM | POA: Diagnosis present

## 2017-08-03 DIAGNOSIS — N183 Chronic kidney disease, stage 3 unspecified: Secondary | ICD-10-CM

## 2017-08-03 DIAGNOSIS — G936 Cerebral edema: Secondary | ICD-10-CM | POA: Diagnosis present

## 2017-08-03 DIAGNOSIS — H548 Legal blindness, as defined in USA: Secondary | ICD-10-CM | POA: Diagnosis present

## 2017-08-03 DIAGNOSIS — Z515 Encounter for palliative care: Secondary | ICD-10-CM | POA: Diagnosis present

## 2017-08-03 DIAGNOSIS — G8194 Hemiplegia, unspecified affecting left nondominant side: Secondary | ICD-10-CM | POA: Diagnosis present

## 2017-08-03 DIAGNOSIS — H518 Other specified disorders of binocular movement: Secondary | ICD-10-CM | POA: Diagnosis present

## 2017-08-03 DIAGNOSIS — I10 Essential (primary) hypertension: Secondary | ICD-10-CM

## 2017-08-03 DIAGNOSIS — I739 Peripheral vascular disease, unspecified: Secondary | ICD-10-CM | POA: Diagnosis present

## 2017-08-03 DIAGNOSIS — R0681 Apnea, not elsewhere classified: Secondary | ICD-10-CM | POA: Diagnosis not present

## 2017-08-03 DIAGNOSIS — F329 Major depressive disorder, single episode, unspecified: Secondary | ICD-10-CM | POA: Diagnosis present

## 2017-08-03 DIAGNOSIS — E876 Hypokalemia: Secondary | ICD-10-CM | POA: Diagnosis present

## 2017-08-03 DIAGNOSIS — R29717 NIHSS score 17: Secondary | ICD-10-CM | POA: Diagnosis present

## 2017-08-03 DIAGNOSIS — I63412 Cerebral infarction due to embolism of left middle cerebral artery: Principal | ICD-10-CM | POA: Diagnosis present

## 2017-08-03 DIAGNOSIS — E785 Hyperlipidemia, unspecified: Secondary | ICD-10-CM | POA: Diagnosis present

## 2017-08-03 DIAGNOSIS — R451 Restlessness and agitation: Secondary | ICD-10-CM | POA: Diagnosis not present

## 2017-08-03 DIAGNOSIS — F17210 Nicotine dependence, cigarettes, uncomplicated: Secondary | ICD-10-CM | POA: Diagnosis present

## 2017-08-03 DIAGNOSIS — I63512 Cerebral infarction due to unspecified occlusion or stenosis of left middle cerebral artery: Secondary | ICD-10-CM | POA: Diagnosis not present

## 2017-08-03 DIAGNOSIS — D72829 Elevated white blood cell count, unspecified: Secondary | ICD-10-CM | POA: Diagnosis present

## 2017-08-03 DIAGNOSIS — N400 Enlarged prostate without lower urinary tract symptoms: Secondary | ICD-10-CM | POA: Diagnosis present

## 2017-08-03 DIAGNOSIS — Z8249 Family history of ischemic heart disease and other diseases of the circulatory system: Secondary | ICD-10-CM

## 2017-08-03 DIAGNOSIS — Z7982 Long term (current) use of aspirin: Secondary | ICD-10-CM | POA: Diagnosis not present

## 2017-08-03 DIAGNOSIS — Z8673 Personal history of transient ischemic attack (TIA), and cerebral infarction without residual deficits: Secondary | ICD-10-CM | POA: Diagnosis not present

## 2017-08-03 DIAGNOSIS — I129 Hypertensive chronic kidney disease with stage 1 through stage 4 chronic kidney disease, or unspecified chronic kidney disease: Secondary | ICD-10-CM | POA: Diagnosis present

## 2017-08-03 DIAGNOSIS — F419 Anxiety disorder, unspecified: Secondary | ICD-10-CM | POA: Diagnosis present

## 2017-08-03 HISTORY — DX: Low back pain: M54.5

## 2017-08-03 HISTORY — DX: Other chronic pain: G89.29

## 2017-08-03 HISTORY — DX: Pure hypercholesterolemia, unspecified: E78.00

## 2017-08-03 HISTORY — DX: Low back pain, unspecified: M54.50

## 2017-08-03 HISTORY — DX: Cerebral infarction, unspecified: I63.9

## 2017-08-03 LAB — ETHANOL: Alcohol, Ethyl (B): <5 mg/dL (ref <5)

## 2017-08-03 LAB — COMPREHENSIVE METABOLIC PANEL
ALBUMIN: 4.1 g/dL (ref 3.5–5.0)
ALK PHOS: 78 U/L (ref 38–126)
ALT: 31 U/L (ref 17–63)
AST: 116 U/L — ABNORMAL HIGH (ref 15–41)
Anion gap: 12 (ref 5–15)
BUN: 32 mg/dL — ABNORMAL HIGH (ref 6–20)
CALCIUM: 8.9 mg/dL (ref 8.9–10.3)
CO2: 22 mmol/L (ref 22–32)
CREATININE: 1.91 mg/dL — AB (ref 0.61–1.24)
Chloride: 103 mmol/L (ref 101–111)
GFR calc Af Amer: 36 mL/min — ABNORMAL LOW (ref >60)
GFR calc non Af Amer: 31 mL/min — ABNORMAL LOW (ref >60)
GLUCOSE: 114 mg/dL — AB (ref 65–99)
Potassium: 3.3 mmol/L — ABNORMAL LOW (ref 3.5–5.1)
SODIUM: 137 mmol/L (ref 135–145)
Total Bilirubin: 0.8 mg/dL (ref 0.3–1.2)
Total Protein: 7.7 g/dL (ref 6.5–8.1)

## 2017-08-03 LAB — I-STAT CHEM 8, ED
BUN: 35 mg/dL — AB (ref 6–20)
CALCIUM ION: 1.1 mmol/L — AB (ref 1.15–1.40)
CHLORIDE: 101 mmol/L (ref 101–111)
Creatinine, Ser: 1.7 mg/dL — ABNORMAL HIGH (ref 0.61–1.24)
Glucose, Bld: 115 mg/dL — ABNORMAL HIGH (ref 65–99)
HEMATOCRIT: 40 % (ref 39.0–52.0)
Hemoglobin: 13.6 g/dL (ref 13.0–17.0)
Potassium: 3.3 mmol/L — ABNORMAL LOW (ref 3.5–5.1)
SODIUM: 140 mmol/L (ref 135–145)
TCO2: 23 mmol/L (ref 22–32)

## 2017-08-03 LAB — PROTIME-INR
INR: 1.12
PROTHROMBIN TIME: 14.3 s (ref 11.4–15.2)

## 2017-08-03 LAB — CBC
HCT: 38.3 % — ABNORMAL LOW (ref 39.0–52.0)
Hemoglobin: 12.8 g/dL — ABNORMAL LOW (ref 13.0–17.0)
MCH: 27.4 pg (ref 26.0–34.0)
MCHC: 33.4 g/dL (ref 30.0–36.0)
MCV: 81.8 fL (ref 78.0–100.0)
PLATELETS: 304 10*3/uL (ref 150–400)
RBC: 4.68 MIL/uL (ref 4.22–5.81)
RDW: 16.7 % — ABNORMAL HIGH (ref 11.5–15.5)
WBC: 16.7 10*3/uL — AB (ref 4.0–10.5)

## 2017-08-03 LAB — DIFFERENTIAL
Basophils Absolute: 0 10*3/uL (ref 0.0–0.1)
Basophils Relative: 0 %
Eosinophils Absolute: 0 10*3/uL (ref 0.0–0.7)
Eosinophils Relative: 0 %
LYMPHS PCT: 6 %
Lymphs Abs: 0.9 10*3/uL (ref 0.7–4.0)
Monocytes Absolute: 2 10*3/uL — ABNORMAL HIGH (ref 0.1–1.0)
Monocytes Relative: 12 %
NEUTROS ABS: 13.7 10*3/uL — AB (ref 1.7–7.7)
NEUTROS PCT: 82 %

## 2017-08-03 LAB — I-STAT TROPONIN, ED: Troponin i, poc: 0.02 ng/mL (ref 0.00–0.08)

## 2017-08-03 LAB — APTT: aPTT: 33 seconds (ref 24–36)

## 2017-08-03 MED ORDER — IOPAMIDOL (ISOVUE-370) INJECTION 76%
INTRAVENOUS | Status: AC
  Administered 2017-08-03: 50 mL
  Filled 2017-08-03: qty 50

## 2017-08-03 MED ORDER — ONDANSETRON HCL 4 MG PO TABS: 4.0000 mg | ORAL_TABLET | Freq: Four times a day (QID) | ORAL | Status: DC | PRN

## 2017-08-03 MED ORDER — ONDANSETRON HCL 4 MG/2ML IJ SOLN: 4.0000 mg | Freq: Four times a day (QID) | INTRAMUSCULAR | Status: DC | PRN

## 2017-08-03 MED ORDER — BIOTENE DRY MOUTH MT LIQD: 15.0000 mL | OROMUCOSAL | Status: DC | PRN

## 2017-08-03 MED ORDER — LORAZEPAM 2 MG/ML IJ SOLN
1.0000 mg | INTRAMUSCULAR | Status: AC
  Administered 2017-08-03: 1 mg via INTRAVENOUS
  Filled 2017-08-03: qty 1

## 2017-08-03 MED ORDER — HALOPERIDOL 1 MG PO TABS: 0.5000 mg | ORAL_TABLET | ORAL | Status: DC | PRN

## 2017-08-03 MED ORDER — ACETAMINOPHEN 650 MG RE SUPP: 650.0000 mg | Freq: Four times a day (QID) | RECTAL | Status: DC | PRN

## 2017-08-03 MED ORDER — ACETAMINOPHEN 325 MG PO TABS: 650.0000 mg | ORAL_TABLET | Freq: Four times a day (QID) | ORAL | Status: DC | PRN

## 2017-08-03 MED ORDER — POLYVINYL ALCOHOL 1.4 % OP SOLN
1.0000 [drp] | Freq: Four times a day (QID) | OPHTHALMIC | Status: DC | PRN
  Administered 2017-08-03: 1 [drp] via OPHTHALMIC
  Filled 2017-08-03: qty 15

## 2017-08-03 MED ORDER — GLYCOPYRROLATE 1 MG PO TABS
1.0000 mg | ORAL_TABLET | ORAL | Status: DC | PRN
  Filled 2017-08-03: qty 1

## 2017-08-03 MED ORDER — GLYCOPYRROLATE 0.2 MG/ML IJ SOLN
0.2000 mg | INTRAMUSCULAR | Status: DC | PRN
  Administered 2017-08-04 – 2017-08-05 (×4): 0.2 mg via INTRAVENOUS
  Filled 2017-08-03 (×5): qty 1

## 2017-08-03 MED ORDER — HALOPERIDOL LACTATE 5 MG/ML IJ SOLN
0.5000 mg | INTRAMUSCULAR | Status: DC | PRN
  Administered 2017-08-03: 0.5 mg via INTRAVENOUS
  Filled 2017-08-03: qty 1

## 2017-08-03 MED ORDER — MORPHINE SULFATE (PF) 4 MG/ML IV SOLN
2.0000 mg | INTRAVENOUS | Status: DC | PRN
  Administered 2017-08-03 – 2017-08-04 (×6): 2 mg via INTRAVENOUS
  Filled 2017-08-03 (×6): qty 1

## 2017-08-03 MED ORDER — LORAZEPAM 2 MG/ML IJ SOLN
1.0000 mg | INTRAMUSCULAR | Status: DC | PRN
  Administered 2017-08-04 (×2): 1 mg via INTRAVENOUS
  Filled 2017-08-03 (×2): qty 1

## 2017-08-03 MED ORDER — GLYCOPYRROLATE 0.2 MG/ML IJ SOLN: 0.2000 mg | INTRAMUSCULAR | Status: DC | PRN

## 2017-08-03 MED ORDER — HALOPERIDOL LACTATE 2 MG/ML PO CONC
0.5000 mg | ORAL | Status: DC | PRN
  Filled 2017-08-03: qty 0.3

## 2017-08-03 NOTE — ED Triage Notes (Signed)
[facility]- pt coming from home,EMS was called yesterday for right side weakness but refused ambulance transport. Pt was found today to be flaccid on the right side and non verbal. Pt lives alone and is self care at baseline. Pt arrives to ED, moving lower extremities, non verbal. Airway intact.

## 2017-08-03 NOTE — ED Provider Notes (Signed)
Emergency Department Provider Note   I have reviewed the triage vital signs and the nursing notes.   HISTORY  Chief Complaint Cerebrovascular Accident   HPI Jerry [name] is a 83 y.o. male with a history of stroke, hypertension, anxiety who presents immersed from today with right-sided weakness and decreased responsiveness. History is obtained from EMS and the patient's brother. The brother states that the patient had onset of right-sided weakness yesterday however refused EMS transport. This may transiently improved but this morning on evaluation the patient was basically unresponsive with worsened weakness was brought here for further evaluation.vital signs stable in route.reportedly stopped takin gmedications a couple months ago.    LEVEL V CAVEAT APPLIES SECONDARY TO unresponsiveness    Past Medical History:  Diagnosis Date  . Anxiety   . Carotid artery occlusion   . CVA (cerebral infarction)   . Depression   . H/O hiatal hernia   . Hypertension   . Legally blind   . Peripheral vascular disease (HCC)   . Stroke [facility](HCC) april 2012    Patient Active Problem List   Diagnosis Date Noted  . Smoker unmotivated to quit 06/13/2015  . Tinnitus 06/13/2015  . Urinary hesitancy 03/01/2012  . BPH (benign prostatic hyperplasia) 03/01/2012  . Low back pain radiating to both legs 03/01/2012  . Dyslipidemia 03/01/2012  . Anxiety 03/01/2012  . Cerebral embolism with cerebral infarction (HCC) 01/30/2012  . Hemiplegia, unspecified, affecting nondominant side 01/30/2012  . Hypertension 01/30/2012    Past Surgical History:  Procedure Laterality Date  . BACK SURGERY    . CAROTID ENDARTERECTOMY  04/30/2011   left  . HERNIA REPAIR    . PROSTATE SURGERY      Current Outpatient Rx  . Order #: [id number] Class: Historical Med  . Order #: [id number] Class: Normal  . Order #: [id number] Class: Normal  . Order #: [id number] Class: Normal  . Order #: [id number] Class: Normal     Allergies Patient has no known allergies.  Family History  Problem Relation Age of Onset  . Cancer Mother   . Heart attack Father   . Heart attack Brother     Social History Social History  Substance Use Topics  . Smoking status: Current Every Day Smoker    Packs/day: 1.00    Years: 65.00    Types: Cigarettes  . Smokeless tobacco: Never Used  . Alcohol use No    Review of Systems  LEVEL V CAVEAT APPLIES SECONDARY TO unresponsiveness ____________________________________________  PHYSICAL EXAM:  VITAL SIGNS: ED Triage Vitals [2017/05/24 0854]  Enc Vitals Group     BP (!) 153/118     Pulse Rate 81     Resp (!) 21     Temp (!) 96.3 F (35.7 C)     Temp Source Temporal     SpO2 98 %     Weight      Height      Head Circumference      Peak Flow      Pain Score      Pain Loc      Pain Edu?      Excl. in GC?     Constitutional: Well appearing. Well nourished. Eyes: Conjunctivae are normal. Equal, round and reactive Pupils.  Head: Atraumatic. Nose: No congestion/rhinnorhea. Mouth/Throat: Mucous membranes are moist.  Oropharynx non-erythematous. Neck: No stridor.  No meningeal signs.   Cardiovascular: normal rate, bigeminy rhythm.  Respiratory: normal respiratory effort.  Lungs diffusely decreased Gastrointestinal: Soft and nontender. No distention.  Musculoskeletal: No lower extremity tenderness nor edema. No gross deformities of extremities. Neurologic:  Not able to assess fully 2/2 condition but does withdraw to pain left greater than right. Pupils equal round and reactive with has a left gaze preference. Not opening eyes or verbalizing spontaneously..  Skin:   No rash noted.multiple areas of ecchymosis and abrasions in various stages of healing. Psych: Not able to assess 2/2 condition.   ____________________________________________   LABS (all labs ordered are listed, but only abnormal results are displayed)  Labs Reviewed  CBC - Abnormal; Notable for  the following:       Result Value   WBC 16.7 (*)    Hemoglobin 12.8 (*)    HCT 38.3 (*)    RDW 16.7 (*)    All other components within normal limits  DIFFERENTIAL - Abnormal; Notable for the following:    Neutro Abs 13.7 (*)    Monocytes Absolute 2.0 (*)    All other components within normal limits  COMPREHENSIVE METABOLIC PANEL - Abnormal; Notable for the following:    Potassium 3.3 (*)    Glucose, Bld 114 (*)    BUN 32 (*)    Creatinine, Ser 1.91 (*)    AST 116 (*)    GFR calc non Af Amer 31 (*)    GFR calc Af Amer 36 (*)    All other components within normal limits  I-STAT CHEM 8, ED - Abnormal; Notable for the following:    Potassium 3.3 (*)    BUN 35 (*)    Creatinine, Ser 1.70 (*)    Glucose, Bld 115 (*)    Calcium, Ion 1.10 (*)    All other components within normal limits  ETHANOL  PROTIME-INR  APTT  RAPID URINE DRUG SCREEN, HOSP PERFORMED  URINALYSIS, ROUTINE W REFLEX MICROSCOPIC  I-STAT TROPONIN, ED   ____________________________________________  EKG   EKG Interpretation  Date/Time:  Wednesday 08/25/2017 08:50:10 EDT Ventricular Rate:  80 PR Interval:    QRS Duration: 90 QT Interval:  406 QTC Calculation: 469 R Axis:   -6 Text Interpretation:  Sinus rhythm Ventricular premature complex Abnormal R-wave progression, early transition Minimal ST depression, anterolateral leads Baseline wander in lead(s) II III aVF Confirmed by Marily Memos [id number]) on 08-25-2017 10:29:15 AM       ____________________________________________  RADIOLOGY  Ct Angio Head W Or Wo Contrast  Result Date: 08-25-2017 CLINICAL DATA:  Suspected cerebral hemorrhage. Right-sided weakness. EXAM: CT ANGIOGRAPHY HEAD AND NECK TECHNIQUE: Multidetector CT imaging of the head and neck was performed using the standard protocol during bolus administration of intravenous contrast. Multiplanar CT image reconstructions and MIPs were obtained to evaluate the vascular anatomy. Carotid  stenosis measurements (when applicable) are obtained utilizing NASCET criteria, using the distal internal carotid diameter as the denominator. CONTRAST:  50 cc Isovue 370 intravenous COMPARISON:  Head CT 01/31/2012 FINDINGS: CT HEAD FINDINGS Brain: Cytotoxic edema throughout the left MCA territory. There are small to moderate remote infarcts in the left occipital lobe and right frontal ACA and MCA territory. No acute hemorrhage. Possible small bilateral hygroma without significant mass effect, measuring up to 5 mm in thickness on the right. No hydrocephalus or masslike findings. Vascular: Hyperdense left MCA vessels.  Atherosclerosis Skull: No acute or aggressive finding Sinuses: No acute finding Orbits: No acute finding.  Bilateral cataract resection Review of the MIP images confirms the above findings CTA NECK FINDINGS Aortic arch: Diffuse atheromatous wall thickening with mural plaque/thrombus. Right carotid system:  Motion at the level of the proximal common carotid. No suspected stenosis at this level. Mild narrowing of the brachiocephalic, non flow limiting. There is atheromatous wall thickening of the distal common carotid. Predominant noncalcified plaque at the common carotid bifurcation and ICA bulb with 40% proximal ICA stenosis. There is moderate to advanced proximal external carotid narrowing. Negative for dissection or beading. Left carotid system: Stenotic common carotid origin measuring 60- 70% on sagital reformats. Patulous distal left common carotid and ICA bulb, correlating with history of carotid endarterectomy. Extensive thrombus is seen along the presumed graft, mainly on the left lateral wall. There is diffuse high-grade narrowing of the proximal ICA due to the thrombus, stenosis measuring up to 70%. The distal left ICA is also irregular and narrowed from either dissection or scattered luminal thrombus. This vessel may have been recently thrombosis and recannulization. Vertebral arteries: No  proximal subclavian flow limiting stenosis. There is advanced bilateral V1 segment stenoses. No generalized beading. Skeleton: No acute or aggressive finding. Status post ACDF. Chronic left mastoiditis. Apparent canal soft tissue density at the level of ACDF is likely related to streak artifact based on reformats. Other neck: No incidental mass or inflammatory process noted. Possible right cheek contusion. Upper chest: Emphysematous changes.  No acute finding. Review of the MIP images confirms the above findings CTA HEAD FINDINGS Anterior circulation: Extensive atherosclerotic plaque on the carotid siphons. There is left M1 thrombus with dense occlusion and no arterial phase branch opacification. No ACA embolism is seen. Right MCA vessels are patent. Posterior circulation: Dominant right vertebral artery. There is a high-grade proximal basilar stenosis. Fetal type right PCA with hypoplastic right P1 segment. High-grade narrowing at the distal left P2 segment. Venous sinuses: Patent Anatomic variants: None other than described above Delayed phase: No unexpected intracranial enhancement. These results were called by telephone at the time of interpretation on 08/13/2017 at 11:33 am to Dr. Marily Memos , who verbally acknowledged these results. Review of the MIP images confirms the above findings IMPRESSION: 1. Acute left MCA territory infarct from M1 embolus and occlusion. 2. Extensive thrombus within left cervical carotid endarterectomy with ICA narrowing to 70%. The mid and distal left ICA is irregular and narrowed, favor continuous thrombus over dissection. 3. Left common carotid origin stenosis of 60-70%. 4. Advanced bilateral V1 segment stenoses. 5. High-grade proximal basilar stenosis. 6. Small remote infarcts in the right MCA, ACA, and left PCA distributions. Electronically Signed   By: Marnee Spring M.D.   On: 08/01/2017 11:36   Ct Angio Neck W And/or Wo Contrast  Result Date: 08/05/2017 CLINICAL DATA:   Suspected cerebral hemorrhage. Right-sided weakness. EXAM: CT ANGIOGRAPHY HEAD AND NECK TECHNIQUE: Multidetector CT imaging of the head and neck was performed using the standard protocol during bolus administration of intravenous contrast. Multiplanar CT image reconstructions and MIPs were obtained to evaluate the vascular anatomy. Carotid stenosis measurements (when applicable) are obtained utilizing NASCET criteria, using the distal internal carotid diameter as the denominator. CONTRAST:  50 cc Isovue 370 intravenous COMPARISON:  Head CT 01/31/2012 FINDINGS: CT HEAD FINDINGS Brain: Cytotoxic edema throughout the left MCA territory. There are small to moderate remote infarcts in the left occipital lobe and right frontal ACA and MCA territory. No acute hemorrhage. Possible small bilateral hygroma without significant mass effect, measuring up to 5 mm in thickness on the right. No hydrocephalus or masslike findings. Vascular: Hyperdense left MCA vessels.  Atherosclerosis Skull: No acute or aggressive finding Sinuses: No acute finding Orbits: No acute  finding.  Bilateral cataract resection Review of the MIP images confirms the above findings CTA NECK FINDINGS Aortic arch: Diffuse atheromatous wall thickening with mural plaque/thrombus. Right carotid system: Motion at the level of the proximal common carotid. No suspected stenosis at this level. Mild narrowing of the brachiocephalic, non flow limiting. There is atheromatous wall thickening of the distal common carotid. Predominant noncalcified plaque at the common carotid bifurcation and ICA bulb with 40% proximal ICA stenosis. There is moderate to advanced proximal external carotid narrowing. Negative for dissection or beading. Left carotid system: Stenotic common carotid origin measuring 60- 70% on sagital reformats. Patulous distal left common carotid and ICA bulb, correlating with history of carotid endarterectomy. Extensive thrombus is seen along the presumed  graft, mainly on the left lateral wall. There is diffuse high-grade narrowing of the proximal ICA due to the thrombus, stenosis measuring up to 70%. The distal left ICA is also irregular and narrowed from either dissection or scattered luminal thrombus. This vessel may have been recently thrombosis and recannulization. Vertebral arteries: No proximal subclavian flow limiting stenosis. There is advanced bilateral V1 segment stenoses. No generalized beading. Skeleton: No acute or aggressive finding. Status post ACDF. Chronic left mastoiditis. Apparent canal soft tissue density at the level of ACDF is likely related to streak artifact based on reformats. Other neck: No incidental mass or inflammatory process noted. Possible right cheek contusion. Upper chest: Emphysematous changes.  No acute finding. Review of the MIP images confirms the above findings CTA HEAD FINDINGS Anterior circulation: Extensive atherosclerotic plaque on the carotid siphons. There is left M1 thrombus with dense occlusion and no arterial phase branch opacification. No ACA embolism is seen. Right MCA vessels are patent. Posterior circulation: Dominant right vertebral artery. There is a high-grade proximal basilar stenosis. Fetal type right PCA with hypoplastic right P1 segment. High-grade narrowing at the distal left P2 segment. Venous sinuses: Patent Anatomic variants: None other than described above Delayed phase: No unexpected intracranial enhancement. These results were called by telephone at the time of interpretation on 08/04/2017 at 11:33 am to Dr. Marily Memos , who verbally acknowledged these results. Review of the MIP images confirms the above findings IMPRESSION: 1. Acute left MCA territory infarct from M1 embolus and occlusion. 2. Extensive thrombus within left cervical carotid endarterectomy with ICA narrowing to 70%. The mid and distal left ICA is irregular and narrowed, favor continuous thrombus over dissection. 3. Left common carotid  origin stenosis of 60-70%. 4. Advanced bilateral V1 segment stenoses. 5. High-grade proximal basilar stenosis. 6. Small remote infarcts in the right MCA, ACA, and left PCA distributions. Electronically Signed   By: Marnee Spring M.D.   On: 08/16/2017 11:36   ____________________________________________   PROCEDURES  Procedure(s) performed:   Procedures ____________________________________________   INITIAL IMPRESSION / ASSESSMENT AND PLAN / ED COURSE  Pertinent labs & imaging results that were available during my care of the patient were reviewed by me and considered in my medical decision making (see chart for details).  83 year old here with a large left-sided MCA occlusion. Patient is DNR/DNI and so not intubated. Not a TPA candidate since we don't know when he was last normal and it was definitely over 16 hours ago. Plan for a neurology consult and medicine admission.  ____________________________________________  FINAL CLINICAL IMPRESSION(S) / ED DIAGNOSES  Final diagnoses:  Acute CVA (cerebrovascular accident) (HCC)    MEDICATIONS GIVEN DURING THIS VISIT:  Medications  iopamidol (ISOVUE-370) 76 % injection (50 mLs  Contrast Given 08/05/2017 1050)  NEW OUTPATIENT MEDICATIONS STARTED DURING THIS VISIT:  New Prescriptions   No medications on file    Note:  This document was prepared using Dragon voice recognition software and may include unintentional dictation errors.    Marily Memos, MD 08/11/2017 1246

## 2017-08-03 NOTE — Consult Note (Signed)
Requesting Physician: Dr. Clayborne Dana    Chief Complaint: Stroke  History obtained from:  Chart  HPI:                                                                                                                                         Jerry [name] is an 83 y.o. male with a history of stroke, hypertension, anxiety who presents today with right-sided weakness and decreased responsiveness. History is obtained from EMS and the patient's brother. The brother states that the patient had onset of right-sided weakness yesterday however refused EMS transport. This transiently improved but this morning on evaluation the patient was basically unresponsive with worsened weakness and was brought here for further evaluation. CTA shows a large acute left MCA territory infarct from M1 embolus and occlusion along with extensive thrombus within the previous left cervical carotid endarterectomy site with ICA narrowing to 70%. The mid and distal left ICA is irregular and narrowed, favor continuous thrombus over dissection. CT head shows hypodensity throughout the left MCA territory. There are small to moderate remote infarcts in the left occipital lobe and right frontal ACA and MCA territory. Currently he his breathing independently, has a left gaze deviation, intact pupils and corneals and flaccid on the right.   Family and POA states they would like nothing done at this time as patient is a DNR/DNI and would not want any heroic efforts if cannot get back to baseline.   Date last known well: Date: 08/02/2017 Time last known well: Unable to determine tPA Given: No: out of time window  Modified Rankin: Rankin Score=0  Past Medical History:  Diagnosis Date  . Anxiety   . Carotid artery occlusion   . CVA (cerebral infarction)   . Depression   . H/O hiatal hernia   . Hypertension   . Legally blind   . Peripheral vascular disease (HCC)   . Stroke [facility]) april 2012    Past Surgical History:  Procedure Laterality  Date  . BACK SURGERY    . CAROTID ENDARTERECTOMY  04/30/2011   left  . HERNIA REPAIR    . PROSTATE SURGERY      Family History  Problem Relation Age of Onset  . Cancer Mother   . Heart attack Father   . Heart attack Brother    Social History:  reports that he has been smoking Cigarettes.  He has a 65.00 pack-year smoking history. He has never used smokeless tobacco. He reports that he does not drink alcohol or use drugs.  Allergies: No Known Allergies  Medications:  No current facility-administered medications for this encounter.    Current Outpatient Prescriptions  Medication Sig Dispense Refill  . aspirin 325 MG tablet Take 325 mg by mouth daily.    . clopidogrel (PLAVIX) 75 MG tablet TAKE 1 TABLET BY MOUTH EVERY DAY 90 tablet 1  . metoprolol succinate (TOPROL-XL) 50 MG 24 hr tablet TAKE 1 TABLET BY MOUTH EVERY DAY WITH FOOD 90 tablet 1  . oxybutynin (DITROPAN) 5 MG tablet Take 1 tablet (5 mg total) by mouth 2 (two) times daily. 60 tablet 1  . simvastatin (ZOCOR) 20 MG tablet TAKE 1 TABLET BY MOUTH EVERY DAY FOR HIGH CHOLESTEROL 90 tablet 1     ROS:                                                                                                                                       unobtainable from patient due to mental status   General Examination:                                                                                                      Blood pressure (!) 179/89, pulse 66, temperature (!) 96.3 F (35.7 C), temperature source Temporal, resp. rate 19, SpO2 97 %.  HEENT-  Normocephalic Cardiovascular- regularly irregular rhythm, pulses palpable throughout   Lungs- chest clear, no wheezing, rales, normal symmetric air entry Abdomen- normal findings: bowel sounds normal Extremities- less then 2 second capillary refill Lymph-no adenopathy  palpable Musculoskeletal-no joint tenderness, deformity or swelling Skin-warm and dry, no hyperpigmentation, vitiligo, or suspicious lesions  Neurological Examination Mental Status: Patient does not respond to verbal stimuli.  Does not respond to deep sternal rub.  Does not follow commands.  No verbalizations noted.  Cranial Nerves: II: patient does not respond confrontation bilaterally, pupils right 2 mm, left 2 mm,and reactive bilaterally III,IV,VI: doll's response present bilaterally.  V,VII: corneal reflex present bilaterally  VIII: No response to verbal IX,X: gag reflex present XI: trapezius strength unable to test bilaterally XII: tongue strength unable to test Motor/Sensory: Extremities flaccid throughout right side except for triple flexion reflex on the right.  Responds to noxious stimuli on the left. Deep Tendon Reflexes: 2+ throughout Plantars: downgoing bilaterally Cerebellar/Gait: Unable to assess   Lab Results: Basic Metabolic Panel:  Recent Labs Lab 05/23/2017 0848 05/23/2017 0926  NA 137 140  K 3.3* 3.3*  CL 103 101  CO2 22  --   GLUCOSE 114* 115*  BUN 32* 35*  CREATININE 1.91*  1.70*  CALCIUM 8.9  --     Liver Function Tests:  Recent Labs Lab 08-21-2017 0848  AST 116*  ALT 31  ALKPHOS 78  BILITOT 0.8  PROT 7.7  ALBUMIN 4.1   No results for input(s): LIPASE, AMYLASE in the last 168 hours. No results for input(s): AMMONIA in the last 168 hours.  CBC:  Recent Labs Lab 08/21/2017 0848 21-Aug-2017 0926  WBC 16.7*  --   NEUTROABS 13.7*  --   HGB 12.8* 13.6  HCT 38.3* 40.0  MCV 81.8  --   PLT 304  --     Cardiac Enzymes: No results for input(s): CKTOTAL, CKMB, CKMBINDEX, TROPONINI in the last 168 hours.  Lipid Panel: No results for input(s): CHOL, TRIG, HDL, CHOLHDL, VLDL, LDLCALC in the last 168 hours.  CBG: No results for input(s): GLUCAP in the last 168 hours.  Microbiology: Results for orders placed or performed during the hospital  encounter of 01/30/12  MRSA PCR Screening     Status: None   Collection Time: 01/30/12  8:48 PM  Result Value Ref Range Status   MRSA by PCR NEGATIVE NEGATIVE Final    Comment:        The GeneXpert MRSA Assay (FDA approved for NASAL specimens only), is one component of a comprehensive MRSA colonization surveillance program. It is not intended to diagnose MRSA infection nor to guide or monitor treatment for MRSA infections.    Coagulation Studies:  Recent Labs  21-Aug-2017 0848  LABPROT 14.3  INR 1.12    Imaging: Ct Angio Head W Or Wo Contrast  Result Date: 08-21-17 CLINICAL DATA:  Suspected cerebral hemorrhage. Right-sided weakness. EXAM: CT ANGIOGRAPHY HEAD AND NECK TECHNIQUE: Multidetector CT imaging of the head and neck was performed using the standard protocol during bolus administration of intravenous contrast. Multiplanar CT image reconstructions and MIPs were obtained to evaluate the vascular anatomy. Carotid stenosis measurements (when applicable) are obtained utilizing NASCET criteria, using the distal internal carotid diameter as the denominator. CONTRAST:  50 cc Isovue 370 intravenous COMPARISON:  Head CT 01/31/2012 FINDINGS: CT HEAD FINDINGS Brain: Cytotoxic edema throughout the left MCA territory. There are small to moderate remote infarcts in the left occipital lobe and right frontal ACA and MCA territory. No acute hemorrhage. Possible small bilateral hygroma without significant mass effect, measuring up to 5 mm in thickness on the right. No hydrocephalus or masslike findings. Vascular: Hyperdense left MCA vessels.  Atherosclerosis Skull: No acute or aggressive finding Sinuses: No acute finding Orbits: No acute finding.  Bilateral cataract resection Review of the MIP images confirms the above findings CTA NECK FINDINGS Aortic arch: Diffuse atheromatous wall thickening with mural plaque/thrombus. Right carotid system: Motion at the level of the proximal common carotid. No  suspected stenosis at this level. Mild narrowing of the brachiocephalic, non flow limiting. There is atheromatous wall thickening of the distal common carotid. Predominant noncalcified plaque at the common carotid bifurcation and ICA bulb with 40% proximal ICA stenosis. There is moderate to advanced proximal external carotid narrowing. Negative for dissection or beading. Left carotid system: Stenotic common carotid origin measuring 60- 70% on sagital reformats. Patulous distal left common carotid and ICA bulb, correlating with history of carotid endarterectomy. Extensive thrombus is seen along the presumed graft, mainly on the left lateral wall. There is diffuse high-grade narrowing of the proximal ICA due to the thrombus, stenosis measuring up to 70%. The distal left ICA is also irregular and narrowed from either dissection or scattered luminal  thrombus. This vessel may have been recently thrombosis and recannulization. Vertebral arteries: No proximal subclavian flow limiting stenosis. There is advanced bilateral V1 segment stenoses. No generalized beading. Skeleton: No acute or aggressive finding. Status post ACDF. Chronic left mastoiditis. Apparent canal soft tissue density at the level of ACDF is likely related to streak artifact based on reformats. Other neck: No incidental mass or inflammatory process noted. Possible right cheek contusion. Upper chest: Emphysematous changes.  No acute finding. Review of the MIP images confirms the above findings CTA HEAD FINDINGS Anterior circulation: Extensive atherosclerotic plaque on the carotid siphons. There is left M1 thrombus with dense occlusion and no arterial phase branch opacification. No ACA embolism is seen. Right MCA vessels are patent. Posterior circulation: Dominant right vertebral artery. There is a high-grade proximal basilar stenosis. Fetal type right PCA with hypoplastic right P1 segment. High-grade narrowing at the distal left P2 segment. Venous sinuses:  Patent Anatomic variants: None other than described above Delayed phase: No unexpected intracranial enhancement. These results were called by telephone at the time of interpretation on 08/16/2017 at 11:33 am to Dr. Marily Memos , who verbally acknowledged these results. Review of the MIP images confirms the above findings IMPRESSION: 1. Acute left MCA territory infarct from M1 embolus and occlusion. 2. Extensive thrombus within left cervical carotid endarterectomy with ICA narrowing to 70%. The mid and distal left ICA is irregular and narrowed, favor continuous thrombus over dissection. 3. Left common carotid origin stenosis of 60-70%. 4. Advanced bilateral V1 segment stenoses. 5. High-grade proximal basilar stenosis. 6. Small remote infarcts in the right MCA, ACA, and left PCA distributions. Electronically Signed   By: Marnee Spring M.D.   On: 07/27/2017 11:36   Ct Angio Neck W And/or Wo Contrast  Result Date: 08/10/2017 CLINICAL DATA:  Suspected cerebral hemorrhage. Right-sided weakness. EXAM: CT ANGIOGRAPHY HEAD AND NECK TECHNIQUE: Multidetector CT imaging of the head and neck was performed using the standard protocol during bolus administration of intravenous contrast. Multiplanar CT image reconstructions and MIPs were obtained to evaluate the vascular anatomy. Carotid stenosis measurements (when applicable) are obtained utilizing NASCET criteria, using the distal internal carotid diameter as the denominator. CONTRAST:  50 cc Isovue 370 intravenous COMPARISON:  Head CT 01/31/2012 FINDINGS: CT HEAD FINDINGS Brain: Cytotoxic edema throughout the left MCA territory. There are small to moderate remote infarcts in the left occipital lobe and right frontal ACA and MCA territory. No acute hemorrhage. Possible small bilateral hygroma without significant mass effect, measuring up to 5 mm in thickness on the right. No hydrocephalus or masslike findings. Vascular: Hyperdense left MCA vessels.  Atherosclerosis Skull:  No acute or aggressive finding Sinuses: No acute finding Orbits: No acute finding.  Bilateral cataract resection Review of the MIP images confirms the above findings CTA NECK FINDINGS Aortic arch: Diffuse atheromatous wall thickening with mural plaque/thrombus. Right carotid system: Motion at the level of the proximal common carotid. No suspected stenosis at this level. Mild narrowing of the brachiocephalic, non flow limiting. There is atheromatous wall thickening of the distal common carotid. Predominant noncalcified plaque at the common carotid bifurcation and ICA bulb with 40% proximal ICA stenosis. There is moderate to advanced proximal external carotid narrowing. Negative for dissection or beading. Left carotid system: Stenotic common carotid origin measuring 60- 70% on sagital reformats. Patulous distal left common carotid and ICA bulb, correlating with history of carotid endarterectomy. Extensive thrombus is seen along the presumed graft, mainly on the left lateral wall. There is diffuse high-grade  narrowing of the proximal ICA due to the thrombus, stenosis measuring up to 70%. The distal left ICA is also irregular and narrowed from either dissection or scattered luminal thrombus. This vessel may have been recently thrombosis and recannulization. Vertebral arteries: No proximal subclavian flow limiting stenosis. There is advanced bilateral V1 segment stenoses. No generalized beading. Skeleton: No acute or aggressive finding. Status post ACDF. Chronic left mastoiditis. Apparent canal soft tissue density at the level of ACDF is likely related to streak artifact based on reformats. Other neck: No incidental mass or inflammatory process noted. Possible right cheek contusion. Upper chest: Emphysematous changes.  No acute finding. Review of the MIP images confirms the above findings CTA HEAD FINDINGS Anterior circulation: Extensive atherosclerotic plaque on the carotid siphons. There is left M1 thrombus with dense  occlusion and no arterial phase branch opacification. No ACA embolism is seen. Right MCA vessels are patent. Posterior circulation: Dominant right vertebral artery. There is a high-grade proximal basilar stenosis. Fetal type right PCA with hypoplastic right P1 segment. High-grade narrowing at the distal left P2 segment. Venous sinuses: Patent Anatomic variants: None other than described above Delayed phase: No unexpected intracranial enhancement. These results were called by telephone at the time of interpretation on 08-31-2017 at 11:33 am to Dr. Marily Memos , who verbally acknowledged these results. Review of the MIP images confirms the above findings IMPRESSION: 1. Acute left MCA territory infarct from M1 embolus and occlusion. 2. Extensive thrombus within left cervical carotid endarterectomy with ICA narrowing to 70%. The mid and distal left ICA is irregular and narrowed, favor continuous thrombus over dissection. 3. Left common carotid origin stenosis of 60-70%. 4. Advanced bilateral V1 segment stenoses. 5. High-grade proximal basilar stenosis. 6. Small remote infarcts in the right MCA, ACA, and left PCA distributions. Electronically Signed   By: Marnee Spring M.D.   On: 08/31/17 11:36    Assessment and plan discussed with with attending physician and they are in agreement. Felicie Morn PA-C [facility] [phone]  08-31-17, 1:46 PM   Assessment: 83 y.o. male presenting to the emergency department with large acute left MCA territory infarct from M1 embolus and occlusion along with extensive thrombus within left cervical carotid endarterectomy with ICA narrowing to 70%.  1. Clinical and imaging findings as well as prognosis have been discussed with family and POA. At this point the patient is a DNR/DNI and family would not like any heroic measures such as hypertonic saline or mannitol to decrease swelling. They've requested a palliative care/hospice consult.   2. Stroke Risk Factors -  carotid stenosis, hypertension and smoking  Recommend: At this time as patient is a DNR/DNI and family has requested no heroic measures to be taken such as hypertonic saline or mannitol. Blood pressure control and rectal aspirin are indicated. Agree with hospice/palliative care consult to further discuss options with family.  35 minutes spent in the acute Neurological assessment of this critically ill acute stroke patient.   Electronically signed: Dr. Caryl Pina

## 2017-08-03 NOTE — ED Notes (Signed)
Attempted report 

## 2017-08-03 NOTE — Progress Notes (Signed)
Received report from Brooke, RN in ED. 

## 2017-08-03 NOTE — H&P (Signed)
Date: 08/04/2017               Patient Name:  Jerry [name] MRN: [id number]  DOB: January 20, 1933 Age / Sex: 83 y.o., male   PCP: Lenell Antu, DO         Medical Service: Internal Medicine Teaching Service         Attending Physician: Dr. Gust Rung, DO    First Contact: Dr. Anthonette Legato Pager: [phone]  Second Contact: Dr. Nelson Chimes Pager: [phone]       After Hours (After 5p/  First Contact Pager: [phone]  weekends / holidays): Second Contact Pager: [phone]   Chief Complaint: Unresponsive  History of Present Illness: Mr. Skillern is an 83 yo M with past medical history of CVA, HTN, BPH, PAD, HLD, tobacco use who presented to the ED with unresponsiveness and R sided weakness. The history Limited due to patient's mental status and was was obtained with chart review and the patient's brother (states he is the pt's healthcare power of attorney).  Yesterday, the patient experienced right-sided weakness and sensory loss, EMS was called but the patient declined to be transported to hospital. At that time, the patient was still able to function and maintain mobility, last seen yesterday evening. This morning, the patient's brother returned to the pt's home and found the patient on the floor of his house unresponsive and not moving his right side.  EMS was subsequently called and he was transported to the hospital.   On chart review, the patient has a history of multiple CVAs dating back to 2012 and 2013 with extensive vascular disease noted on imaging at the time. It appears he has had carotid endarterectomy in the past. The brother reports he has not been taking his medications for the last several years.  In the ED, T 96.3, HR 78, 152/115, 97% on RA. Labs significant for K 3.3, Cr 1.91, WBC 16.7. CT and CTA Head/Neck showed an acute left MCA infarct with cytotoxic edema d/t occlusion of the L M1 segment, extensive vascular disease in both anterior and posterior circulation, and remote infarcts in  various vascular territories.  He was admitted for further management.    Meds:  Not currently taking medications  Allergies: Allergies as of 2017-08-04  . (No Known Allergies)   Past Medical History:  Diagnosis Date  . Anxiety   . Carotid artery occlusion   . CVA (cerebral infarction)   . Depression   . H/O hiatal hernia   . Hypertension   . Legally blind   . Peripheral vascular disease (HCC)   . Stroke [facility]) april 2012    Family History:  Family History  Problem Relation Age of Onset  . Cancer Mother   . Heart attack Father   . Heart attack Brother      Social History:  Significant smoking history, starting at the age of 83 years old. History of alcohol use,not drinking in the past several years  Review of Systems: Unable to obtain due to patient's mental status   Physical Exam: Blood pressure (!) 158/93, pulse 66, temperature (!) 96.3 F (35.7 C), temperature source Temporal, resp. rate (!) 22, SpO2 100 %. Physical Exam  Constitutional:  Elderly male, unresponsive   HENT:  Head: Normocephalic.  Eyes: Pupils are equal, round, and reactive to light. Conjunctivae are normal.  Leftward gaze deviation   Cardiovascular: Normal rate, regular rhythm and intact distal pulses.   Pulmonary/Chest:  Upper airway sounds limiting lung  auscultation   Abdominal: Soft. He exhibits no distension.  Musculoskeletal: He exhibits no edema.  Neurological:  Unresponsive, withdrawals to pain on L extremities, + Babinski/triple reflex on RLE. Corneal reflex intact. Spontaneously moving L extremities  Skin: Skin is warm and dry.     EKG: personally reviewed my interpretation is sinus rhythm with underlying artifact.    Assessment & Plan by Problem:  Acute CVA, L MCA, Advanced Directives   Pt with an extensive stroke and vascular history presenting with right-sided weakness and unresponsiveness, found to have an acute left MCA territory infarct due to occlusion of left M1  segment. Given the size of the area of infarct he is at significant risk of cerebral edema and herniation. Discussed conservative options to limit edema including hypertonic saline, mannitol with serial CT scans. However, on discussion with the patient's brother the patient would desire no further intervention. He states his brother was ready to die and wanted to pass when his time came. This appears to be consistent with previous behavior documented in the notes including refusing ambulance transport and ceasing to take medications. Palliative options and the expected course of his condition were discussed and they indicated the patient would want to be kept comfortable with no other interventions consistent with comfort care measures. They are open to palliative care discussions and potentially hospice placement if the patient stabilizes. --Monitor for signs of agitation, discomfort --Palliative Care consult  --Morphine, Ativan prn  --Glycopyrrolate for excessive secretions    Dispo: Admit patient to Inpatient with expected length of stay greater than 2 midnights.  Signed: Ginger CarneHarden, Dalton Mille, MD 04-07-17, 3:53 PM  Pager: [phone]

## 2017-08-03 NOTE — Progress Notes (Signed)
Palliative Medicine RN Note: Consult order noted.   Met with pt's brother Dominica Severin and sister in law in ED. Patient is agitated despite haloperidol, moving left side and pulling on bed rails. He has redness to right outer eye and right arm. Not able to follow commands. Dominica Severin reports that goal is 100% comfort care, and that the patient has repeatedly said to let him go if he is dying.   Discussed option of hospice inpt vs admission to [facility]. Dominica Severin reports that he was told [facility] can be admitted for 3 days and will then be moved to hospice. [facility] Bartolo Darter is pending arrival tomorrow afternoon/evening, so pt will continue to be reassessed in conjunction with safety of moving him.  Discussed Mr Coe with Dr Hilma Favors. Comfort orders intiated per her orders. Discussed with Dr Heber [place] and Dr. Cheral Marker. Plan for PMT follow up tomorrow for symptoms and goals/plans.  Marjie Skiff Cletus Mehlhoff, RN, BSN, [facility] 08/09/2017 4:12 PM Cell [phone] 8:00-4:00 Monday-Friday Office [phone]

## 2017-08-04 DIAGNOSIS — N183 Chronic kidney disease, stage 3 unspecified: Secondary | ICD-10-CM

## 2017-08-04 DIAGNOSIS — D72829 Elevated white blood cell count, unspecified: Secondary | ICD-10-CM

## 2017-08-04 DIAGNOSIS — I63512 Cerebral infarction due to unspecified occlusion or stenosis of left middle cerebral artery: Secondary | ICD-10-CM

## 2017-08-04 DIAGNOSIS — I639 Cerebral infarction, unspecified: Secondary | ICD-10-CM

## 2017-08-04 MED ORDER — LORAZEPAM 2 MG/ML IJ SOLN
2.0000 mg | INTRAMUSCULAR | Status: AC
  Administered 2017-08-04: 2 mg via INTRAVENOUS
  Filled 2017-08-04: qty 1

## 2017-08-04 MED ORDER — MORPHINE SULFATE (PF) 4 MG/ML IV SOLN
2.0000 mg | INTRAVENOUS | Status: DC
  Administered 2017-08-04 – 2017-08-05 (×6): 2 mg via INTRAVENOUS
  Filled 2017-08-04 (×6): qty 1

## 2017-08-04 MED ORDER — LORAZEPAM 2 MG/ML IJ SOLN
2.0000 mg | Freq: Four times a day (QID) | INTRAMUSCULAR | Status: DC
  Administered 2017-08-04 – 2017-08-06 (×7): 2 mg via INTRAVENOUS
  Filled 2017-08-04 (×8): qty 1

## 2017-08-04 NOTE — Progress Notes (Signed)
Palliative Medicine RN Note: Afternoon symptom check. Pt is non-responsive. RN reports that he calmed down earlier today after 2 doses of morphine and one of ativan. She was giving the next scheduled dose while I was there. Pt is having Cheyne-Stokes respirations with 30-60 seconds of apnea.   Brother is present. Jillyn HiddenGary understands that death is very near and is ready for the pt to let go.   Plan for PMT to follow up tomorrow if pt survives the night, which would be unexpected.  Margret ChanceMelanie G. Ercell Razon, RN, BSN, [facility] 08/04/2017 4:03 PM Cell [phone] 8:00-4:00 Monday-Friday Office [phone]

## 2017-08-04 NOTE — Progress Notes (Signed)
Pt restless, trying to pull out external catheter, pulling side rails up, throwing away pillows and taking his gown off. PRN ativan given.

## 2017-08-04 NOTE — Progress Notes (Signed)
Palliative Medicine RN Note: Morning symptom check. Pt is agitated, constantly moving left arm and leg. Brother Jillyn HiddenGary is present and agrees that pt appears uncomfortable. Facial grimacing present. Pt got multiple doses of prn morphine overnight (2131, 2335, 0136, 0531) and got ativan once ([id number]).   Spoke with RN who will bring dose of prn morphine; encouraged liberal use of prn meds. Too soon for prn lorazepam. Discussed w Dr Phillips OdorGolding, who scheduled morphine and ativan. Discussed continuous infusion, but pt is too agitated to leave IV in, and usage so far cannot support the need for a continuous infusion.   Plan for PMT to re-evaluate this afternoon. If pt remains uncomfortable or is using frequent prn doses, Dr Phillips OdorGolding may initiate continuous infusion at that time. Family has contact information for PMT in case of needs before we come back this afternoon.  Margret ChanceMelanie G. Darlynn Ricco, RN, BSN, [facility] 08/04/2017 11:11 AM Cell [phone] 8:00-4:00 Monday-Friday Office [phone]

## 2017-08-04 NOTE — Progress Notes (Signed)
Internal Medicine Attending:   I saw and examined the patient. I reviewed the resident's note and I agree with the resident's findings and plan as documented in the resident's note. On my evaluation patient remained unresponsive, increased respiratory rate.  Does withdrawal from pain, spontaneous movement of left side with some minor movement of right leg. Agree with scheduling morphine and ativan.  Appreciate palliative care assistance.  Expect likely in hospital death.

## 2017-08-04 NOTE — Progress Notes (Signed)
Nutrition Brief Note  Chart reviewed. Pt now transitioning to comfort care.  No further nutrition interventions warranted at this time.  Please re-consult as needed.   Mahlani Berninger A. Juliany Daughety, RD, LDN, CDE Pager: [phone] After hours Pager: [phone]  

## 2017-08-04 NOTE — Progress Notes (Signed)
   Subjective: Pt has had some intermittent agitation with increased movements of his L side, given several doses of morphine overnight, ativan this morning. Calm during rounds. Family not present in room during morning rounds   Objective:  Vital signs in last 24 hours: Vitals:   12/17/2016 1815 12/17/2016 1830 12/17/2016 2030 08/04/17 0540  BP: (!) 164/80 (!) 164/96 (!) 166/97 132/82  Pulse: 69 80 95 69  Resp: (!) 22 (!) 21 (!) 21 20  Temp:   99 F (37.2 C) 97.7 F (36.5 C)  TempSrc:   Oral Axillary  SpO2: 100% 100% 95% 93%  Weight:   130 lb 8 oz (59.2 kg)   Height:   5\' 6"  (1.676 m)    Physical Exam  Constitutional:  Unresponsive, laying in bed calmly   Eyes:  Constricted pupils   Cardiovascular: Intact distal pulses.   Pulmonary/Chest: Effort normal.  Neurological:  + Babinski on right, some withdrawal on L side   Skin: Skin is warm and dry.     Assessment/Plan:  Acute CVA, L MCA, Advanced Directives   Pt with an extensive stroke and vascular history found to have an acute left MCA territory infarct due to occlusion of left M1 segment with significant risk of cerebral edema and herniation. Pt's brother (and [place]) stated the pt would desire to be comfort care in this situation and declined conservative management to prevent cerebral edema. Palliative care consulted, appreciate their recommendations to increase patient comfort.  --Monitor for signs of agitation, discomfort --Morphine 2 mg q4hr scheduled, additional 2 mg q1hr prn --Ativan 2 mg q6hr scheduled, additional 1 mg q4hr prn   --Glycopyrrolate for excessive secretions   Dispo: Anticipated discharge pending progression vs hospice placement.    Ginger CarneHarden, Brysen Shankman, MD 08/04/2017, 11:56 AM Pager: [phone]

## 2017-08-04 NOTE — Progress Notes (Signed)
STROKE TEAM PROGRESS NOTE   HISTORY OF PRESENT ILLNESS (per record) Jerry [name] is an 83 y.o. male with a history of stroke, hypertension, anxiety who presents 08/12/2017 with right-sided weakness and decreased responsiveness. History is obtained from EMS and the patient's brother. The brother states that the patient had onset of right-sided weakness yesterday however refused EMS transport. This transiently improved but this morning on evaluation the patient was basically unresponsive with worsened weakness and was brought here for further evaluation. CTA shows a large acute left MCA territory infarct from M1 embolus and occlusion along with extensive thrombus within the previous left cervical carotid endarterectomy site with ICA narrowing to 70%. The mid and distal left ICA is irregular and narrowed, favor continuous thrombus over dissection. CT head shows hypodensity throughout the left MCA territory. There are small to moderate remote infarcts in the left occipital lobe and right frontal ACA and MCA territory. Currently he his breathing independently, has a left gaze deviation, intact pupils and corneals and flaccid on the right.   Family and POA states they would like nothing done at this time as patient is a DNR/DNI and would not want any heroic efforts if cannot get back to baseline.   Date last known well: Date: 08/02/2017 Time last known well: Unable to determine Modified Rankin: Rankin Score=0  Patient was not administered IV t-PA secondary to arriving outside of the tPA treatment window. He was admitted to [facility] for comfort care.   SUBJECTIVE (INTERVAL HISTORY) No family is at the bedside.  His brother was earlier at bedside. chenye-stocks respiration. No acute distress. On morphine, Ativan when necessary   OBJECTIVE Temp:  [97.7 F (36.5 C)-99 F (37.2 C)] 97.7 F (36.5 C) (09/13 0540) Pulse Rate:  [69-98] 69 (09/13 0540) Resp:  [20-23] 20 (09/13 0540) BP: (132-172)/(80-103)  132/82 (09/13 0540) SpO2:  [93 %-100 %] 93 % (09/13 0540) Weight:  [59.2 kg (130 lb 8 oz)] 59.2 kg (130 lb 8 oz) (09/12 2030)  CBC:   Recent Labs Lab 08/07/2017 0848 07/26/2017 0926  WBC 16.7*  --   NEUTROABS 13.7*  --   HGB 12.8* 13.6  HCT 38.3* 40.0  MCV 81.8  --   PLT 304  --     Basic Metabolic Panel:   Recent Labs Lab 07/26/2017 0848 08/14/2017 0926  NA 137 140  K 3.3* 3.3*  CL 103 101  CO2 22  --   GLUCOSE 114* 115*  BUN 32* 35*  CREATININE 1.91* 1.70*  CALCIUM 8.9  --     Lipid Panel:     Component Value Date/Time   CHOL 154 06/13/2015 0908   TRIG 246 (H) 06/13/2015 0908   HDL 51 06/13/2015 0908   CHOLHDL 3.0 06/13/2015 0908   VLDL 49 (H) 06/13/2015 0908   LDLCALC 54 06/13/2015 0908   HgbA1c:  Lab Results  Component Value Date   HGBA1C 6.0 (H) 01/31/2012   Urine Drug Screen: No results found for: LABOPIA, COCAINSCRNUR, LABBENZ, AMPHETMU, THCU, LABBARB  Alcohol Level     Component Value Date/Time   ETH <5 07/23/2017 0848    IMAGING I have personally reviewed the radiological images below and agree with the radiology interpretations.  Ct Angio Head W Or Wo Contrast Ct Angio Neck W And/or Wo Contrast  08/21/2017 IMPRESSION: 1. Acute left MCA territory infarct from M1 embolus and occlusion. 2. Extensive thrombus within left cervical carotid endarterectomy with ICA narrowing to 70%. The mid and distal left ICA is  irregular and narrowed, favor continuous thrombus over dissection. 3. Left common carotid origin stenosis of 60-70%. 4. Advanced bilateral V1 segment stenoses. 5. High-grade proximal basilar stenosis. 6. Small remote infarcts in the right MCA, ACA, and left PCA distributions.   PHYSICAL EXAM  Temp:  [97.7 F (36.5 C)-99 F (37.2 C)] 97.7 F (36.5 C) (09/13 0540) Pulse Rate:  [69-98] 69 (09/13 0540) Resp:  [20-23] 20 (09/13 0540) BP: (132-172)/(80-97) 132/82 (09/13 0540) SpO2:  [93 %-100 %] 93 % (09/13 0540) Weight:  [130 lb 8 oz (59.2 kg)]  130 lb 8 oz (59.2 kg) (09/12 2030)  General - Well nourished, well developed, unresponsive.  Ophthalmologic - Fundi not visualized due to comfort care.  Cardiovascular - Regular rate and rhythm.  Neuro - limited due to comfort care. Unresponsive to voice, eyes close, no spontaneous movement in all limbs. Chenye stocks respiration. Right nasolabial fold flattening.    ASSESSMENT/PLAN Jerry [name] is a 83 y.o. male with history of stroke, hypertension, anxiety who presents 24-Jan-2017 with right-sided weakness and decreased responsiveness. He did not receive IV t-PA due to arriving outside of the tPA treatment window.   Stroke:  Acute left MCA territory infarct from M1 embolus and occlusion, embolic, secondary to large occlusive thrombus in L CEA site of L ICA with 70% stenosis, in setting multiple sites of high-grade arterial atherosclerotic stenosis, and multiple remote infarcts  Resultant  Unresponsive, cheyne-stocks respiration  CTA head/neck: Acute left MCA territory infarct from M1 embolus and occlusion  LDL 54  HgbA1c 6.0  No VTE prophylaxis: comfort care  Diet NPO time specified  aspirin 325 mg daily and clopidogrel 75 mg daily prior to admission, now on No antithrombotic  Brother requested comfort care measures  Hypertension  Stable  Antihypertensives held due to comfort care status  Hyperlipidemia  Home meds: simvastatin 20 mg PO daily  LDL 54, goal < 70  Statin held due to comfort care statust  Prediabetes  HgbA1c 6.0, goal < 7.0  Controlled  Other Stroke Risk Factors  Advanced age  Former smoker, 106.5 pack-years  Hx stroke/TIA  Other Active Problems  CKD stage III  Hypokalemia  Leukocytosis - WBC 16.7  Hospital day # 1  Neurology will sign off. Please call with questions. Thanks for the consult.  Marvel PlanJindong Rayland Hamed, MD PhD Stroke Neurology 08/04/2017 5:12 PM   To contact Stroke Continuity provider, please refer to [email or web address]. After  hours, contact General Neurology

## 2017-08-05 MED ORDER — GLYCOPYRROLATE 0.2 MG/ML IJ SOLN
0.4000 mg | Freq: Four times a day (QID) | INTRAMUSCULAR | Status: DC
  Administered 2017-08-05 – 2017-08-06 (×4): 0.4 mg via INTRAVENOUS
  Filled 2017-08-05 (×4): qty 2

## 2017-08-05 MED ORDER — MORPHINE SULFATE (PF) 4 MG/ML IV SOLN
2.0000 mg | INTRAVENOUS | Status: DC
  Administered 2017-08-05 – 2017-08-06 (×7): 2 mg via INTRAVENOUS
  Filled 2017-08-05 (×7): qty 1

## 2017-08-05 NOTE — Progress Notes (Signed)
Palliative Medicine RN Note: Symptom check. Pt is having some pauses of 30-40 seconds followed by wet, labored respirations. Obtained orders to schedule Robinul and increase morphine frequency. Brother Jillyn Hidden at bedside; discussed decline.   Pt will have continuing checks for symptoms over the weekend.  Margret Chance Mylena Sedberry, RN, BSN, [facility] 08/05/2017 10:13 AM Cell [phone] 8:00-4:00 Monday-Friday Office [phone]

## 2017-08-05 NOTE — Progress Notes (Signed)
Internal Medicine Attending:   I saw and examined the patient. I reviewed the resident's note and I agree with the resident's findings and plan as documented in the resident's note; Cheynes-Stokes respirations with periods of apnea, remains unresponsive, palliative has increased morphine frequency.

## 2017-08-05 NOTE — Consult Note (Signed)
Consultation Note Date: 08/05/2017   Patient Name: Jerry [name]  DOB: [birth year]  MRN: [id number]  Age / Sex: 83 y.o., male  PCP: Lenell Antu, DO Referring Physician: Gust Rung, DO  Reason for Consultation: Terminal Care  HPI/Patient Profile: 83 y.o. male  with past medical history of poorly controlled HTN and stroke admitted last PM through ED with very large LMCA occulsive stroke. The patient had been declining in health, continued to be a heavy smoker and asserted a desire to allow him to have a natural death in an event such as this, He is now comfort care.  Clinical Assessment and Goals of Care:  Patient is at EOL. Agitation predominant symptom. Will start scheduled Ativan and PRN Ativan and Morphine.    SUMMARY OF RECOMMENDATIONS    Code Status/Advance Care Planning:  DNR    Symptom Management:   Scheduled IV Ativan and PRN morphine  Palliative Prophylaxis:   Aspiration, Frequent Pain Assessment, Oral Care and Turn Reposition  Additional Recommendations (Limitations, Scope, Preferences):  Full Comfort Care  Psycho-social/Spiritual:   Desire for further Chaplaincy support:no  Additional Recommendations: Grief/Bereavement Support  Prognosis:   Hours-days  Discharge Planning: Anticipated Hospital Death      Primary Diagnoses: Present on Admission: . Acute CVA (cerebrovascular accident) (HCC)   I have reviewed the medical record, interviewed the patient and family, and examined the patient. The following aspects are pertinent.  Past Medical History:  Diagnosis Date  . Anxiety   . Carotid artery occlusion   . Chronic lower back pain   . CVA (cerebral vascular accident) [facility]) april 2012  . Depression   . H/O hiatal hernia   . High cholesterol   . Hypertension   . Legally blind   . Peripheral vascular disease (HCC)   . Stroke (HCC) 08/02/2017 X 2; 07/28/2017    Social History   Social History  . Marital status: Widowed    Spouse name: N/A  . Number of children: N/A  . Years of education: N/A   Social History Main Topics  . Smoking status: Current Every Day Smoker    Packs/day: 1.50    Years: 71.00    Types: Cigarettes  . Smokeless tobacco: Never Used  . Alcohol use Yes     Comment: 07/30/2017 "h/o abuse; last drink was ~ 2010"  . Drug use: No  . Sexual activity: No   Other Topics Concern  . None   Social History Narrative  . None   Family History  Problem Relation Age of Onset  . Cancer Mother   . Heart attack Father   . Heart attack Brother    Scheduled Meds: . glycopyrrolate  0.4 mg Intravenous Q6H  . LORazepam  2 mg Intravenous Q6H  .  morphine injection  2 mg Intravenous Q3H   Continuous Infusions: PRN Meds:.antiseptic oral rinse, [DISCONTINUED] glycopyrrolate **OR** [DISCONTINUED] glycopyrrolate **OR** glycopyrrolate, [DISCONTINUED] haloperidol **OR** [DISCONTINUED] haloperidol **OR** haloperidol lactate, LORazepam, morphine injection, [DISCONTINUED] ondansetron **OR** ondansetron (ZOFRAN) IV, polyvinyl alcohol Medications  Prior to Admission:  Prior to Admission medications   Medication Sig Start Date End Date Taking? Authorizing Provider  aspirin 325 MG tablet Take 325 mg by mouth daily.    [provider]  clopidogrel (PLAVIX) 75 MG tablet TAKE 1 TABLET BY MOUTH EVERY DAY 06/30/15   Shade Flood, MD  metoprolol succinate (TOPROL-XL) 50 MG 24 hr tablet TAKE 1 TABLET BY MOUTH EVERY DAY WITH FOOD 06/30/15   Shade Flood, MD  oxybutynin (DITROPAN) 5 MG tablet Take 1 tablet (5 mg total) by mouth 2 (two) times daily. 06/13/15   Porfirio Oar, PA-C  simvastatin (ZOCOR) 20 MG tablet TAKE 1 TABLET BY MOUTH EVERY DAY FOR HIGH CHOLESTEROL 06/19/15   Le, Thao P, DO   No Known Allergies Review of Systems  Physical Exam  Vital Signs: BP 98/72 (BP Location: Right Arm)   Pulse (!) 112   Temp 98.9 F (37.2 C)  (Axillary)   Resp 20   Ht  (1.676 m)   Wt 59.2 kg (130 lb 8 oz)   SpO2 (!) 81%   BMI 21.06 kg/m  Pain Assessment: PAINAD   Pain Score: Asleep   SpO2: SpO2: (!) 81 % O2 Device:SpO2: (!) 81 % O2 Flow Rate: .   IO: Intake/output summary:  Intake/Output Summary (Last 24 hours) at 08/05/17 1227 Last data filed at 08/05/17 0630  Gross per 24 hour  Intake                0 ml  Output                0 ml  Net                0 ml    LBM:   Baseline Weight: Weight: 59.2 kg (130 lb 8 oz) Most recent weight: Weight: 59.2 kg (130 lb 8 oz)     Palliative Assessment/Data:   Flowsheet Rows     Most Recent Value  Intake Tab  Referral Department  -- [Internal Medicine Teaching Service]  Unit at Time of Referral  ER  Palliative Care Primary Diagnosis  Neurology  Date Notified  August 25, 2017  Palliative Care Type  New Palliative care  Reason for referral  Clarify Goals of Care, End of Life Care Assistance  Date of Admission  25-Aug-2017  Date first seen by Palliative Care  Aug 25, 2017  # of days Palliative referral response time  0 Day(s)  # of days IP prior to Palliative referral  0  Clinical Assessment  Palliative Performance Scale Score  10%  Pain Max last 24 hours  Not able to report  Pain Min Last 24 hours  Not able to report  Dyspnea Max Last 24 Hours  Not able to report  Dyspnea Min Last 24 hours  Not able to report  Nausea Max Last 24 Hours  Not able to report  Nausea Min Last 24 Hours  Not able to report  Anxiety Max Last 24 Hours  Not able to report  Anxiety Min Last 24 Hours  Not able to report  Psychosocial & Spiritual Assessment  Palliative Care Outcomes  Patient/Family meeting held?  Yes  Who was at the meeting?  Brother Donnalee Curry & Sister in law  Palliative Care Outcomes  Improved pain interventions, Clarified goals of care, Provided end of life care assistance, Changed to focus on comfort      Time Total: 40 minutes Greater than 50%  of this time was spent  counseling and coordinating care related to the above assessment and plan.  Signed by: Anderson Malta, DO   Please contact Palliative Medicine Team phone at [phone] for questions and concerns.  For individual provider: See Loretha Stapler

## 2017-08-05 NOTE — Progress Notes (Signed)
Pt with periods of apnea, labored respirations. Scheduled Morphine and Ativan given. Pt looks comfortable. Brother at the bedside.

## 2017-08-05 NOTE — Progress Notes (Addendum)
   Subjective: Yesterday afternoon pt was noted to have Cheynes-Stokes respirations with periods of apnea. He has had improved control of agitation/discomfort with scheduled medications. This morning he has a similar respiration pattern, upper airway gargling.  Objective:  Vital signs in last 24 hours: Vitals:   08-27-2017 1830 08/27/2017 2030 08/04/17 0540 08/05/17 0629  BP: (!) 164/96 (!) 166/97 132/82 98/72  Pulse: 80 95 69 (!) 112  Resp: (!) 21 (!) Temp:  99 F (37.2 C) 97.7 F (36.5 C) 98.9 F (37.2 C)  TempSrc:  Oral Axillary Axillary  SpO2: 100% 95% 93% (!) 81%  Weight:  130 lb 8 oz (59.2 kg)    Height:   (1.676 m)     Physical Exam  Constitutional:  Laying in bed without agitation  Pulmonary/Chest:  Cheynes-Stokes pattern of respiration with periods of apnea   Neurological:  Continues to be unresponsive     Assessment/Plan:  Acute CVA, L MCA, Advanced Directives   Pt with an extensive stroke and vascular history found to have an acute left MCA territory infarct due to occlusion of left M1 segment with significant risk of cerebral edema and herniation. Pt's brother (and [place]) stated the pt would desire to be comfort care in this situation and declined conservative management to prevent cerebral edema. Palliative care consulted, appreciate their recommendations to increase patient comfort. Death is likely imminent.   --Monitor for signs of agitation, discomfort --Morphine 2 mg q4hr scheduled, additional 2 mg q1hr prn --Ativan 2 mg q6hr scheduled, additional 1 mg q4hr prn   --Glycopyrrolate for excessive secretions   Unresponsiveness, Stupor Due to large MCA stroke as above.    Dispo: Anticipated discharge pending progression to death vs hospice placement.    Ginger Carne, MD 08/05/2017, 7:09 AM Pager: [phone]

## 2017-08-22 NOTE — Death Summary Note (Signed)
  Name: Jerry [name] MRN: [id number] DOB: 19-Apr-1933 83 y.o.  Date of Admission: 2017-08-17  8:41 AM Date of Discharge: 07/26/2017 Attending Physician: No att. providers found  Discharge Diagnosis: Principal Problem:   Acute CVA (cerebrovascular accident) (HCC) Active Problems:   Leukocytosis   CKD (chronic kidney disease) stage 3, GFR 30-59 ml/min   Cause of death: Massive MCA infarct. Time of death: 8:15 AM  Disposition and follow-up:   [name] was discharged from [facility] in expired condition.    Hospital Course: [name] with PMHx significant for hypertension and CVA was brought to the hospital via EMS after his brother found him unresponsive on floor. He was found to have a massive acute left MCA infarct. Brother who is also his power of attorney decided to go for palliative care and refused any interventional medical help including giving him some hypertonic saline to decreased his intracranial pressure. According to brother he never wants to live as a vegetative state and was noncompliant with his medications and excessively smoking for the past few years. He understood that without any medical help that will be imminent, even with extensive medical intervention outcome might be a complete right-sided paralysis because of the intensity of his disease. Palliative care was consulted and patient was placed on scheduled morphine and Ativan to keep him comfortable. He remained unresponsive but appears comfortable during his last couple of days in hospital. He was pronounced death by nurse 8:15 a.m.which was later confirmed by the IMTS team.  Signed: Arnetha Courser, MD 07/31/2017, 10:57 AM

## 2017-08-22 NOTE — Progress Notes (Signed)
Gold colored ring given to patients POA Jillyn Hidden (brother) at his request. 08/05/17 at 2200.

## 2017-08-22 NOTE — Progress Notes (Signed)
Patient pronounced dead( no respiration, no pulse appreciated , pupils fully dilated) at 8:15 by myself and Yvone Neu, RN. Family and attending MD notified . [place] [facility] notified .

## 2017-08-22 DEATH — deceased
